# Patient Record
Sex: Female | Born: 1980
Health system: Southern US, Community
[De-identification: ages and names within clinical notes are randomized; demographics above are authoritative.]

## PROBLEM LIST (undated history)

## (undated) DIAGNOSIS — G47419 Narcolepsy without cataplexy: Secondary | ICD-10-CM

## (undated) DIAGNOSIS — G5601 Carpal tunnel syndrome, right upper limb: Secondary | ICD-10-CM

## (undated) DIAGNOSIS — F32A Depression, unspecified: Secondary | ICD-10-CM

## (undated) DIAGNOSIS — R51 Headache: Secondary | ICD-10-CM

## (undated) DIAGNOSIS — K743 Primary biliary cirrhosis: Secondary | ICD-10-CM

## (undated) HISTORY — DX: Primary biliary cirrhosis: K74.3

## (undated) HISTORY — PX: UPPER GASTROINTESTINAL ENDOSCOPY: SHX188

## (undated) HISTORY — PX: ANKLE FRACTURE SURGERY: SHX122

## (undated) HISTORY — DX: Depression, unspecified: F32.A

## (undated) HISTORY — DX: Narcolepsy without cataplexy: G47.419

---

## 1998-06-23 ENCOUNTER — Emergency Department (HOSPITAL_COMMUNITY): Admission: EM | Admit: 1998-06-23 | Discharge: 1998-06-23 | Payer: Self-pay | Admitting: Emergency Medicine

## 1999-03-01 ENCOUNTER — Emergency Department (HOSPITAL_COMMUNITY): Admission: EM | Admit: 1999-03-01 | Discharge: 1999-03-01 | Payer: Self-pay | Admitting: Emergency Medicine

## 2002-08-21 ENCOUNTER — Other Ambulatory Visit: Admission: RE | Admit: 2002-08-21 | Discharge: 2002-08-21 | Payer: Self-pay | Admitting: Obstetrics and Gynecology

## 2003-04-26 ENCOUNTER — Encounter: Payer: Self-pay | Admitting: Emergency Medicine

## 2003-04-26 ENCOUNTER — Emergency Department (HOSPITAL_COMMUNITY): Admission: EM | Admit: 2003-04-26 | Discharge: 2003-04-26 | Payer: Self-pay | Admitting: Emergency Medicine

## 2004-09-20 ENCOUNTER — Emergency Department (HOSPITAL_COMMUNITY): Admission: EM | Admit: 2004-09-20 | Discharge: 2004-09-20 | Payer: Self-pay | Admitting: Family Medicine

## 2007-02-23 ENCOUNTER — Emergency Department (HOSPITAL_COMMUNITY): Admission: EM | Admit: 2007-02-23 | Discharge: 2007-02-24 | Payer: Self-pay | Admitting: Emergency Medicine

## 2008-02-22 ENCOUNTER — Emergency Department (HOSPITAL_COMMUNITY): Admission: EM | Admit: 2008-02-22 | Discharge: 2008-02-22 | Payer: Self-pay | Admitting: Family Medicine

## 2008-02-26 ENCOUNTER — Encounter: Admission: RE | Admit: 2008-02-26 | Discharge: 2008-02-26 | Payer: Self-pay | Admitting: Family Medicine

## 2008-02-26 ENCOUNTER — Encounter (INDEPENDENT_AMBULATORY_CARE_PROVIDER_SITE_OTHER): Payer: Self-pay | Admitting: General Surgery

## 2008-02-26 ENCOUNTER — Observation Stay (HOSPITAL_COMMUNITY): Admission: EM | Admit: 2008-02-26 | Discharge: 2008-02-27 | Payer: Self-pay | Admitting: Emergency Medicine

## 2008-02-26 HISTORY — PX: APPENDECTOMY: SHX54

## 2009-12-09 ENCOUNTER — Other Ambulatory Visit: Admission: RE | Admit: 2009-12-09 | Discharge: 2009-12-09 | Payer: Self-pay | Admitting: Obstetrics and Gynecology

## 2010-03-30 ENCOUNTER — Inpatient Hospital Stay (HOSPITAL_COMMUNITY): Admission: AD | Admit: 2010-03-30 | Discharge: 2010-03-30 | Payer: Self-pay | Admitting: Obstetrics and Gynecology

## 2010-06-14 ENCOUNTER — Inpatient Hospital Stay (HOSPITAL_COMMUNITY): Admission: AD | Admit: 2010-06-14 | Discharge: 2010-06-17 | Payer: Self-pay | Admitting: Obstetrics and Gynecology

## 2010-12-08 ENCOUNTER — Other Ambulatory Visit (HOSPITAL_COMMUNITY)
Admission: RE | Admit: 2010-12-08 | Discharge: 2010-12-08 | Disposition: A | Discharge status: Home / Self Care | Payer: Medicaid Other | Source: Ambulatory Visit | Attending: Obstetrics and Gynecology | Admitting: Obstetrics and Gynecology

## 2010-12-08 ENCOUNTER — Other Ambulatory Visit: Payer: Self-pay | Admitting: Obstetrics and Gynecology

## 2010-12-08 DIAGNOSIS — Z1159 Encounter for screening for other viral diseases: Secondary | ICD-10-CM | POA: Insufficient documentation

## 2010-12-08 DIAGNOSIS — Z01419 Encounter for gynecological examination (general) (routine) without abnormal findings: Secondary | ICD-10-CM | POA: Insufficient documentation

## 2011-01-10 ENCOUNTER — Other Ambulatory Visit: Payer: Self-pay | Admitting: Obstetrics and Gynecology

## 2011-01-21 ENCOUNTER — Other Ambulatory Visit (HOSPITAL_COMMUNITY): Payer: Medicaid Other

## 2011-01-21 LAB — CBC
HCT: 35.8 % — ABNORMAL LOW (ref 36.0–46.0)
HCT: 40.2 % (ref 36.0–46.0)
Hemoglobin: 12.3 g/dL (ref 12.0–15.0)
Hemoglobin: 13.3 g/dL (ref 12.0–15.0)
MCH: 29.1 pg (ref 26.0–34.0)
MCH: 30 pg (ref 26.0–34.0)
MCHC: 33 g/dL (ref 30.0–36.0)
MCHC: 34.4 g/dL (ref 30.0–36.0)
MCV: 87.3 fL (ref 78.0–100.0)
MCV: 88 fL (ref 78.0–100.0)
Platelets: 223 10*3/uL (ref 150–400)
Platelets: 249 10*3/uL (ref 150–400)
RBC: 4.1 MIL/uL (ref 3.87–5.11)
RBC: 4.57 MIL/uL (ref 3.87–5.11)
RDW: 14 % (ref 11.5–15.5)
RDW: 14.4 % (ref 11.5–15.5)
WBC: 10.6 10*3/uL — ABNORMAL HIGH (ref 4.0–10.5)
WBC: 10.9 10*3/uL — ABNORMAL HIGH (ref 4.0–10.5)

## 2011-01-21 LAB — RPR: RPR Ser Ql: NONREACTIVE

## 2011-01-24 LAB — URINALYSIS, ROUTINE W REFLEX MICROSCOPIC
Bilirubin Urine: NEGATIVE
Glucose, UA: NEGATIVE mg/dL
Ketones, ur: NEGATIVE mg/dL
Nitrite: NEGATIVE
Protein, ur: NEGATIVE mg/dL
Specific Gravity, Urine: <1.005 — ABNORMAL LOW (ref 1.005–1.030)
Urobilinogen, UA: 0.2 mg/dL (ref 0.0–1.0)
pH: 7 (ref 5.0–8.0)

## 2011-01-24 LAB — URINE MICROSCOPIC-ADD ON

## 2011-01-25 ENCOUNTER — Encounter (HOSPITAL_COMMUNITY)
Admission: RE | Admit: 2011-01-25 | Discharge: 2011-01-25 | Disposition: A | Discharge status: Home / Self Care | Payer: Medicaid Other | Source: Ambulatory Visit | Attending: Obstetrics and Gynecology | Admitting: Obstetrics and Gynecology

## 2011-01-25 LAB — CBC
HCT: 41.5 % (ref 36.0–46.0)
Hemoglobin: 14 g/dL (ref 12.0–15.0)
MCH: 28.3 pg (ref 26.0–34.0)
MCHC: 33.7 g/dL (ref 30.0–36.0)
MCV: 84 fL (ref 78.0–100.0)
Platelets: 308 10*3/uL (ref 150–400)
RBC: 4.94 MIL/uL (ref 3.87–5.11)
RDW: 12.8 % (ref 11.5–15.5)
WBC: 8.2 10*3/uL (ref 4.0–10.5)

## 2011-01-25 LAB — SURGICAL PCR SCREEN
MRSA, PCR: NEGATIVE
Staphylococcus aureus: NEGATIVE

## 2011-01-27 ENCOUNTER — Ambulatory Visit (HOSPITAL_COMMUNITY)
Admission: RE | Admit: 2011-01-27 | Discharge: 2011-01-27 | Disposition: A | Discharge status: Home / Self Care | Payer: Medicaid Other | Source: Ambulatory Visit | Attending: Obstetrics and Gynecology | Admitting: Obstetrics and Gynecology

## 2011-01-27 DIAGNOSIS — Z302 Encounter for sterilization: Secondary | ICD-10-CM | POA: Insufficient documentation

## 2011-01-27 HISTORY — PX: TUBAL LIGATION: SHX77

## 2011-01-27 LAB — PREGNANCY, URINE: Preg Test, Ur: NEGATIVE

## 2011-02-01 NOTE — Op Note (Signed)
  [name], Tara Vega                ACCOUNT NO.:  [id number]  MEDICAL RECORD NO.:  [id number]        PATIENT TYPE:  WAMB  LOCATION:                                FACILITY:  [facility]  PHYSICIAN:  Gerald Leitz, MD          DATE OF BIRTH:  1981-09-02  DATE OF PROCEDURE: DATE OF DISCHARGE:                              OPERATIVE REPORT   PREOPERATIVE DIAGNOSIS:  Desires permanent sterilization.  POSTOPERATIVE DIAGNOSIS:  Desires permanent sterilization.  PROCEDURE:  Laparoscopic bilateral tubal ligation with Filshie clips.  SURGEON:  Gerald Leitz, MD  ASSISTANT:  None.  ANESTHESIA:  General.  FINDINGS:  Normal uterus, fallopian tubes, and ovaries.  SPECIMEN:  None.  ESTIMATED BLOOD LOSS:  Minimal.  COMPLICATIONS:  None.  PROCEDURE IN DETAIL:  The patient was taken to the operating room where she was placed under general anesthesia and placed in the dorsal lithotomy position.  Her abdomen was prepped and draped in the usual sterile fashion.  The vagina was prepped and a uterine manipulator was placed.  The bladder wall was in-and-out catheterized.  Attention was turned to the umbilicus where a 10-mm incision was made with a scalpel. Prior to doing this, it was injected with 10 mL of 0.25% Marcaine.  The 10-mm trocar was placed under direct visualization.  Once peritoneal placement was visualized, pneumoperitoneum was achieved with CO2 gas. Pelvis was examined with the findings noted above.  The fimbriae were identified bilaterally and Filshie clips were placed across the right fallopian tube with an operative laparoscope.  This was repeated on the left fallopian tube.  Pneumoperitoneum was released and the umbilical trocar was removed under direct visualization.  The fascia was identified at the umbilical incision and reapproximated with 0-Vicryl. Skin was closed with 4-0 Vicryl.  Attention was turned to the vagina where the uterine manipulator was removed.  The patient was noted  to have some bleeding from the tenaculum sites.  Silver nitrate was applied and excellent hemostasis was noted.  The patient was awakened from anesthesia and taken to recovery room awake and in stable condition. Sponge, lap, and needle counts were correct x2.     Gerald Leitz, MD     TC/MEDQ  D:  01/27/2011  T:  01/28/2011  Job:  [id number]  Electronically Signed by Gerald Leitz MD on 02/01/2011 06:34:23 PM

## 2011-03-22 NOTE — H&P (Signed)
NAMEAMENA, DOCKHAM                ACCOUNT NO.:  [id number]   MEDICAL RECORD NO.:  [id number]          PATIENT TYPE:  INP   LOCATION:  5127                         FACILITY:  [facility]   PHYSICIAN:  Cherylynn Ridges, M.D.    DATE OF BIRTH:  04/05/1981   DATE OF ADMISSION:  02/26/2008  DATE OF DISCHARGE:                              HISTORY & PHYSICAL   IDENTIFICATION:   CHIEF COMPLAINT:  The patient is a 30 year old with abdominal pain and  the CT scan demonstrating acute appendicitis.   HISTORY OF PRESENT ILLNESS:  The patient started getting ill yesterday  evening with abdominal pain, sort of localized in the lower portion of  abdomen and then towards the right lower quadrant.  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  She went in to see her primary care physician about 1 o'clock  this afternoon, who thought the patient had acute appendicitis, sent her  to diagnostic imaging for CT scan.  This demonstrate acute appendicitis.   The patient had a similar episode about 1 year ago of abdominal pain,  which subsided in less than 24 hours, but was very similar in nature  with an ultrasound not demonstrating acute appendicitis.  With the CT  findings today, a surgical consultation was obtained.   PAST MEDICAL HISTORY:  Unremarkable.   PAST SURGICAL HISTORY:  None.  She has had no previous surgery.   MEDICATIONS:  Only Depo for birth control.   ALLERGIES:  She has allergies to ASPIRIN only.   SOCIAL HISTORY:  She is a nonsmoker, nondrinker, does not take any  drugs.  She is employed as a Armed forces training and education officer.   REVIEW OF SYSTEMS:  She has had a cough for the past week, was seen in  Urgent Care, diagnosed with bronchitis, not placed on any antibiotics.   FAMILY HISTORY:  Noncontributory.   PHYSICAL EXAMINATION:  VITAL SIGNS:  On examination, she is afebrile at  97.8, pulse 87, blood pressure 121/87, O2 sats are 100% on room air.  HEENT:  She is normocephalic and atraumatic and anicteric.  NECK:  Supple with no cervical adenopathy and no carotid bruits.  LUNGS:  Clear to auscultation.  CARDIAC:  Regular rhythm and rate with no murmurs.  ABDOMEN:  Soft in all four quadrants with hypoactive bowel sounds.  She  is tender in the right lower quadrant with voluntary guarding.  She has  tap and shake tenderness, rebound tenderness in the right lower quadrant  and a positive Rovsing sign.  RECTAL AND PELVIC:  Not performed.   LABORATORY DATA:  Laboratory studies are pending.  A CBC is pending.  UA  is pending.  I did review her CT scan, which demonstrated a cylindrical  thickened wall structure in the right lower quadrant adjacent to the  cecum.  Diagnostic of acute appendicitis with some periappendiceal  stranding.   IMPRESSION:  Acute appendicitis without rupture.  The plan is to perform  laparoscopic appendectomy with possibility of open based on anatomy and  inflammation.  I have  explained this to the patient and wishes to  proceed as soon as we are able to go to the operating room.  She  understands.      Cherylynn Ridges, M.D.  Electronically Signed     JOW/MEDQ  D:  02/26/2008  T:  02/27/2008  Job:  [id number]

## 2011-03-22 NOTE — Op Note (Signed)
NAMEMARYAN, Tara Vega                ACCOUNT NO.:  [id number]   MEDICAL RECORD NO.:  [id number]          PATIENT TYPE:  INP   LOCATION:  1833                         FACILITY:  [facility]   PHYSICIAN:  Cherylynn Ridges, M.D.    DATE OF BIRTH:  1981-01-29   DATE OF PROCEDURE:  02/26/2008  DATE OF DISCHARGE:                               OPERATIVE REPORT   PREOPERATIVE DIAGNOSIS:  Acute appendicitis.   POSTOPERATIVE DIAGNOSIS:  Acute appendicitis.   PROCEDURE:  Laparoscopic appendectomy.   SURGEON:  Dr. Lindie Spruce.   ANESTHESIA:  General endotracheal.   ESTIMATED BLOOD LOSS:  Less than 50 mL.   COMPLICATIONS:  None.   CONDITION:  Stable.   FINDINGS:  The patient had an acutely inflamed partially  retroperitonealized appendix without perforation.   INDICATIONS FOR OPERATION:  The patient is a 30 year old with abdominal  pain for 24 hours, localized to the right lower quadrant, a history of  pain like this about a year ago, likely chronic relapsing acute  appendicitis.   OPERATION:  The patient was taken to the operating room and placed on  the table in supine position.  After an adequate general endotracheal  anesthetic was administered, she was prepped and draped in the usual  sterile manner.   A supraumbilical curvilinear incision was made using an #11 blade taken  down to the midline fascia.  We grabbed the fascia with 2 Kocher clamps  and then incised the fascia longitudinally with a 15 blade into the  preperitoneal space.  While tenting up on the fascial edges with Kocher  clamps, we bluntly dissected down into the peritoneal cavity and  subsequently passed a pursestring suture of 0 Vicryl around the fascial  opening.  We passed a Hasson cannula into the peritoneal cavity and  subsequently a right upper quadrant 5-mm cannula and a suprapubic 12-mm  cannula under direct vision after insufflating the abdomen with carbon  dioxide gas.   The patient was placed in Trendelenburg.  The  left side was tilted down.  The acutely inflamed appendix was noted in the right lower quadrant and  was partially retroperitonealized by the inflammation.  We were able to  dissect this away from the posterolateral wall using electrocautery and  blunt dissection, freeing up the appendix at the base of the cecum.  We  made a window between the mesoappendix and the base of the appendix  using a dissector and then came across the base of the appendix at the  cecum with an Endo-GIA 3.5-mm closure stapler.  The mesoappendix was  taken with 2.5-mm closure staples.  There was minimal bleeding.  We used  irrigation and electrocautery to control bleeding.  There was minimal  bleeding.  We irrigated with 2 bags of saline solution.  Once this was  done, the patient was flattened out.  Irrigation and all gas were  removed from the pelvis and the right upper quadrant, then all  instruments were removed.   The supraumbilical fascia was closed using a pursestring suture which  was in place.  We injected 0.25% Marcaine with epi  at all sites.  We  closed the supraumbilical and suprapubic sites with a running  subcuticular stitch of 5-0 Vicryl and then Dermabond was applied to all  incisions, including closing the lateral 5-mm cannula sites.  Steri-  Strips, Dermabond and Tegaderm were applied.  All counts were correct,  including needles, sponges and instruments.      Cherylynn Ridges, M.D.  Electronically Signed     JOW/MEDQ  D:  02/26/2008  T:  02/26/2008  Job:  [id number]

## 2011-08-02 LAB — DIFFERENTIAL
Basophils Absolute: 0.1
Basophils Relative: 1
Eosinophils Absolute: 0.1
Eosinophils Relative: 1
Lymphocytes Relative: 36
Lymphs Abs: 3.7
Monocytes Absolute: 0.4
Monocytes Relative: 4
Neutro Abs: 5.9
Neutrophils Relative %: 58

## 2011-08-02 LAB — URINALYSIS, ROUTINE W REFLEX MICROSCOPIC
Bilirubin Urine: NEGATIVE
Glucose, UA: NEGATIVE
Hgb urine dipstick: NEGATIVE
Ketones, ur: NEGATIVE
Nitrite: NEGATIVE
Protein, ur: NEGATIVE
Specific Gravity, Urine: >1.046 — ABNORMAL HIGH
Urobilinogen, UA: 1
pH: 6

## 2011-08-02 LAB — CBC
HCT: 39.4
Hemoglobin: 13.7
MCHC: 34.7
MCV: 89.4
Platelets: 268
RBC: 4.4
RDW: 12.5
WBC: 10.1

## 2011-09-06 ENCOUNTER — Other Ambulatory Visit (HOSPITAL_COMMUNITY)
Admission: RE | Admit: 2011-09-06 | Discharge: 2011-09-06 | Disposition: A | Discharge status: Home / Self Care | Payer: Medicaid Other | Source: Ambulatory Visit | Attending: Obstetrics and Gynecology | Admitting: Obstetrics and Gynecology

## 2011-09-06 ENCOUNTER — Other Ambulatory Visit: Payer: Self-pay | Admitting: Obstetrics and Gynecology

## 2011-09-06 DIAGNOSIS — Z01419 Encounter for gynecological examination (general) (routine) without abnormal findings: Secondary | ICD-10-CM | POA: Insufficient documentation

## 2011-12-14 ENCOUNTER — Other Ambulatory Visit: Payer: Self-pay | Admitting: Obstetrics and Gynecology

## 2011-12-14 ENCOUNTER — Other Ambulatory Visit (HOSPITAL_COMMUNITY)
Admission: RE | Admit: 2011-12-14 | Discharge: 2011-12-14 | Disposition: A | Discharge status: Home / Self Care | Payer: Medicaid Other | Source: Ambulatory Visit | Attending: Obstetrics and Gynecology | Admitting: Obstetrics and Gynecology

## 2011-12-14 DIAGNOSIS — Z01419 Encounter for gynecological examination (general) (routine) without abnormal findings: Secondary | ICD-10-CM | POA: Insufficient documentation

## 2012-09-13 ENCOUNTER — Other Ambulatory Visit: Payer: Self-pay | Admitting: Orthopedic Surgery

## 2012-10-07 DIAGNOSIS — G5601 Carpal tunnel syndrome, right upper limb: Secondary | ICD-10-CM

## 2012-10-07 HISTORY — DX: Carpal tunnel syndrome, right upper limb: G56.01

## 2012-10-08 ENCOUNTER — Encounter (HOSPITAL_BASED_OUTPATIENT_CLINIC_OR_DEPARTMENT_OTHER): Payer: Self-pay | Admitting: *Deleted

## 2012-10-08 NOTE — Pre-Procedure Instructions (Signed)
Phentermine use discussed with Dr. Michelle Piper; pt. OK to come for surgery.

## 2012-10-11 ENCOUNTER — Ambulatory Visit (HOSPITAL_BASED_OUTPATIENT_CLINIC_OR_DEPARTMENT_OTHER)
Admission: RE | Admit: 2012-10-11 | Discharge: 2012-10-11 | Disposition: A | Discharge status: Home / Self Care | Payer: Medicaid Other | Source: Ambulatory Visit | Attending: Orthopedic Surgery | Admitting: Orthopedic Surgery

## 2012-10-11 ENCOUNTER — Encounter (HOSPITAL_BASED_OUTPATIENT_CLINIC_OR_DEPARTMENT_OTHER): Payer: Self-pay | Admitting: Anesthesiology

## 2012-10-11 ENCOUNTER — Ambulatory Visit (HOSPITAL_BASED_OUTPATIENT_CLINIC_OR_DEPARTMENT_OTHER): Payer: Medicaid Other | Admitting: Anesthesiology

## 2012-10-11 ENCOUNTER — Encounter (HOSPITAL_BASED_OUTPATIENT_CLINIC_OR_DEPARTMENT_OTHER)
Admission: RE | Disposition: A | Discharge status: Home / Self Care | Payer: Self-pay | Source: Ambulatory Visit | Attending: Orthopedic Surgery

## 2012-10-11 ENCOUNTER — Encounter (HOSPITAL_BASED_OUTPATIENT_CLINIC_OR_DEPARTMENT_OTHER): Payer: Self-pay | Admitting: *Deleted

## 2012-10-11 DIAGNOSIS — G5600 Carpal tunnel syndrome, unspecified upper limb: Secondary | ICD-10-CM | POA: Insufficient documentation

## 2012-10-11 HISTORY — DX: Headache: R51

## 2012-10-11 HISTORY — PX: CARPAL TUNNEL RELEASE: SHX101

## 2012-10-11 HISTORY — DX: Carpal tunnel syndrome, right upper limb: G56.01

## 2012-10-11 LAB — POCT HEMOGLOBIN-HEMACUE: Hemoglobin: 14.8 g/dL (ref 12.0–15.0)

## 2012-10-11 SURGERY — CARPAL TUNNEL RELEASE
Anesthesia: Monitor Anesthesia Care | Site: Hand | Laterality: Right | Wound class: Clean

## 2012-10-11 MED ORDER — OXYCODONE HCL 5 MG PO TABS: 5.0000 mg | ORAL_TABLET | Freq: Once | ORAL | Status: DC | PRN

## 2012-10-11 MED ORDER — CEFAZOLIN SODIUM-DEXTROSE 2-3 GM-% IV SOLR
2.0000 g | INTRAVENOUS | Status: AC
  Administered 2012-10-11: 2 g via INTRAVENOUS

## 2012-10-11 MED ORDER — BUPIVACAINE HCL (PF) 0.25 % IJ SOLN
INTRAMUSCULAR | Status: DC | PRN
  Administered 2012-10-11: 20 mL

## 2012-10-11 MED ORDER — LACTATED RINGERS IV SOLN
INTRAVENOUS | Status: DC
  Administered 2012-10-11: 12:00:00 via INTRAVENOUS

## 2012-10-11 MED ORDER — HYDROMORPHONE HCL PF 1 MG/ML IJ SOLN
0.2500 mg | INTRAMUSCULAR | Status: DC | PRN
  Administered 2012-10-11 (×2): 0.5 mg via INTRAVENOUS

## 2012-10-11 MED ORDER — HYDROCODONE-ACETAMINOPHEN 5-500 MG PO TABS: 2.0000 | ORAL_TABLET | ORAL | Status: DC | PRN

## 2012-10-11 MED ORDER — MIDAZOLAM HCL 5 MG/5ML IJ SOLN
INTRAMUSCULAR | Status: DC | PRN
  Administered 2012-10-11: 2 mg via INTRAVENOUS

## 2012-10-11 MED ORDER — CHLORHEXIDINE GLUCONATE 4 % EX LIQD: 60.0000 mL | Freq: Once | CUTANEOUS | Status: DC

## 2012-10-11 MED ORDER — OXYCODONE HCL 5 MG/5ML PO SOLN: 5.0000 mg | Freq: Once | ORAL | Status: DC | PRN

## 2012-10-11 MED ORDER — FENTANYL CITRATE 0.05 MG/ML IJ SOLN
INTRAMUSCULAR | Status: DC | PRN
  Administered 2012-10-11: 100 ug via INTRAVENOUS

## 2012-10-11 MED ORDER — SODIUM CHLORIDE 0.45 % IV SOLN: INTRAVENOUS | Status: DC

## 2012-10-11 SURGICAL SUPPLY — 46 items
BANDAGE CONFORM 3  STR LF (GAUZE/BANDAGES/DRESSINGS) ×2 IMPLANT
BANDAGE ELASTIC 3 VELCRO ST LF (GAUZE/BANDAGES/DRESSINGS) ×2 IMPLANT
BLADE CARPAL TUNNEL SNGL USE (BLADE) ×2 IMPLANT
BLADE SURG 15 STRL LF DISP TIS (BLADE) ×2 IMPLANT
BLADE SURG 15 STRL SS (BLADE) ×2
BNDG PLASTER X FAST 3X3 WHT LF (CAST SUPPLIES) ×2 IMPLANT
BRUSH SCRUB EZ PLAIN DRY (MISCELLANEOUS) ×2 IMPLANT
CLOTH BEACON ORANGE TIMEOUT ST (SAFETY) ×2 IMPLANT
CORDS BIPOLAR (ELECTRODE) ×2 IMPLANT
COVER MAYO STAND STRL (DRAPES) ×2 IMPLANT
COVER TABLE BACK 60X90 (DRAPES) ×2 IMPLANT
CUFF TOURNIQUET SINGLE 18IN (TOURNIQUET CUFF) ×2 IMPLANT
DRAIN PENROSE 1/4X12 LTX STRL (WOUND CARE) IMPLANT
DRAPE EXTREMITY T 121X128X90 (DRAPE) ×2 IMPLANT
DRAPE SURG 17X23 STRL (DRAPES) ×2 IMPLANT
DRSG EMULSION OIL 3X3 NADH (GAUZE/BANDAGES/DRESSINGS) ×2 IMPLANT
GAUZE SPONGE 4X4 16PLY XRAY LF (GAUZE/BANDAGES/DRESSINGS) ×2 IMPLANT
GLOVE BIOGEL M STRL SZ7.5 (GLOVE) ×4 IMPLANT
GLOVE BIOGEL PI IND STRL 8 (GLOVE) ×1 IMPLANT
GLOVE BIOGEL PI INDICATOR 8 (GLOVE) ×1
GLOVE SS BIOGEL STRL SZ 8 (GLOVE) ×1 IMPLANT
GLOVE SUPERSENSE BIOGEL SZ 8 (GLOVE) ×1
GOWN PREVENTION PLUS XLARGE (GOWN DISPOSABLE) ×2 IMPLANT
GOWN PREVENTION PLUS XXLARGE (GOWN DISPOSABLE) ×4 IMPLANT
LOOP VESSEL MAXI BLUE (MISCELLANEOUS) IMPLANT
NDL SAFETY ECLIPSE 18X1.5 (NEEDLE) ×2 IMPLANT
NEEDLE HYPO 18GX1.5 SHARP (NEEDLE) ×2
NEEDLE HYPO 22GX1.5 SAFETY (NEEDLE) IMPLANT
NEEDLE HYPO 25X1 1.5 SAFETY (NEEDLE) ×2 IMPLANT
NS IRRIG 1000ML POUR BTL (IV SOLUTION) ×2 IMPLANT
PACK BASIN DAY SURGERY FS (CUSTOM PROCEDURE TRAY) ×2 IMPLANT
PAD ALCOHOL SWAB (MISCELLANEOUS) ×16 IMPLANT
PAD CAST 3X4 CTTN HI CHSV (CAST SUPPLIES) ×2 IMPLANT
PADDING CAST ABS 4INX4YD NS (CAST SUPPLIES) ×1
PADDING CAST ABS COTTON 4X4 ST (CAST SUPPLIES) ×1 IMPLANT
PADDING CAST COTTON 3X4 STRL (CAST SUPPLIES) ×2
SPONGE GAUZE 4X4 12PLY (GAUZE/BANDAGES/DRESSINGS) IMPLANT
STOCKINETTE 4X48 STRL (DRAPES) ×2 IMPLANT
SUT PROLENE 4 0 PS 2 18 (SUTURE) ×2 IMPLANT
SYR BULB 3OZ (MISCELLANEOUS) ×2 IMPLANT
SYR CONTROL 10ML LL (SYRINGE) ×4 IMPLANT
TOWEL OR 17X24 6PK STRL BLUE (TOWEL DISPOSABLE) ×2 IMPLANT
TOWEL OR NON WOVEN STRL DISP B (DISPOSABLE) ×2 IMPLANT
TRAY DSU PREP LF (CUSTOM PROCEDURE TRAY) ×2 IMPLANT
UNDERPAD 30X30 INCONTINENT (UNDERPADS AND DIAPERS) ×2 IMPLANT
WATER STERILE IRR 1000ML POUR (IV SOLUTION) ×2 IMPLANT

## 2012-10-11 NOTE — Op Note (Signed)
See [id number] Dominica Severin MD

## 2012-10-11 NOTE — Anesthesia Preprocedure Evaluation (Signed)
Anesthesia Evaluation  Patient identified by MRN, date of birth, ID band Patient awake    Reviewed: Allergy & Precautions, H&P , NPO status , Patient's Chart, lab work & pertinent test results  Airway Mallampati: II TM Distance: >3 FB Neck ROM: Full    Dental No notable dental hx. (+) Teeth Intact and Dental Advisory Given   Pulmonary neg pulmonary ROS,  breath sounds clear to auscultation  Pulmonary exam normal       Cardiovascular negative cardio ROS  Rhythm:Regular Rate:Normal     Neuro/Psych  Headaches,  Neuromuscular disease negative psych ROS   GI/Hepatic negative GI ROS, Neg liver ROS,   Endo/Other  Morbid obesity  Renal/GU negative Renal ROS  negative genitourinary   Musculoskeletal   Abdominal   Peds  Hematology negative hematology ROS (+)   Anesthesia Other Findings   Reproductive/Obstetrics negative OB ROS                           Anesthesia Physical Anesthesia Plan  ASA: III  Anesthesia Plan: MAC   Post-op Pain Management:    Induction: Intravenous  Airway Management Planned: Simple Face Mask  Additional Equipment:   Intra-op Plan:   Post-operative Plan:   Informed Consent: I have reviewed the patients History and Physical, chart, labs and discussed the procedure including the risks, benefits and alternatives for the proposed anesthesia with the patient or authorized representative who has indicated his/her understanding and acceptance.   Dental advisory given  Plan Discussed with: CRNA  Anesthesia Plan Comments:         Anesthesia Quick Evaluation

## 2012-10-11 NOTE — Transfer of Care (Signed)
Immediate Anesthesia Transfer of Care Note  Patient: Tara Vega  Procedure(s) Performed: Procedure(s) (LRB) with comments: CARPAL TUNNEL RELEASE (Right)  Patient Location: PACU  Anesthesia Type:MAC  Level of Consciousness: awake, alert  and oriented  Airway & Oxygen Therapy: Patient Spontanous Breathing  Post-op Assessment: Report given to PACU RN and Post -op Vital signs reviewed and stable  Post vital signs: Reviewed and stable  Complications: No apparent anesthesia complications

## 2012-10-11 NOTE — H&P (Signed)
Tara Vega is an 31 y.o. female.   Chief Complaint: Presents for R CTR HPI: Tara KitchenMarland KitchenPatient presents for evaluation and treatment of the of their upper extremity predicament. The patient denies neck back chest or of abdominal pain. The patient notes that they have no lower extremity problems. The patient from primarily complains of the upper extremity pain noted.  Past Medical History  Diagnosis Date  . Headache     tension; migraines 1-2 x/month  . Carpal tunnel syndrome, right 10/2012    Past Surgical History  Procedure Date  . Tubal ligation 01/27/2011    laparoscopic  . Appendectomy 02/26/2008    laparoscopic  . Ankle fracture surgery     History reviewed. No pertinent family history. Social History:  reports that she has never smoked. She has never used smokeless tobacco. She reports that she does not drink alcohol or use illicit drugs.  Allergies:  Allergies  Allergen Reactions  . Aspirin Other (See Comments)    NOSEBLEEDS    Medications Prior to Admission  Medication Sig Dispense Refill  . Multiple Vitamin (MULTIVITAMIN) tablet Take 1 tablet by mouth daily.      . phentermine 37.5 MG capsule Take 18.75 mg by mouth every morning.       . sertraline (ZOLOFT) 100 MG tablet Take 100 mg by mouth daily.        No results found for this or any previous visit (from the past 48 hour(s)). No results found.  Review of Systems  HENT: Negative.   Respiratory: Negative.   Cardiovascular: Negative.   Gastrointestinal: Negative.   Genitourinary: Negative.   Neurological: Negative.   Endo/Heme/Allergies: Negative.     Blood pressure 131/85, pulse 68, temperature 98 F (36.7 C), temperature source Oral, resp. rate 20, height 5\' 1"  (1.549 m), weight 97.07 kg (214 lb), last menstrual period 10/11/2012, SpO2 96.00%. Physical Exam ..The patient is alert and oriented in no acute distress the patient complains of pain in the affected upper extremity. The patient is noted to have a  normal HEENT exam. Lung fields show equal chest expansion and no shortness of breath abdomen exam is nontender without distention. Lower extremity examination does not show any fracture dislocation or blood clot symptoms. Pelvis is stable neck and back are stable and nontender  Presents with R CTS  Assessment/Plan .Tara KitchenWe are planning surgery for your upper extremity. The risk and benefits of surgery include risk of bleeding infection anesthesia damage to normal structures and failure of the surgery to accomplish its intended goals of relieving symptoms and restoring function with this in mind we'll going to proceed. I have specifically discussed with the patient the pre-and postoperative regime and the does and don'ts and risk and benefits in great detail. Risk and benefits of surgery also include risk of dystrophy chronic nerve pain failure of the healing process to go onto completion and other inherent risks of surgery The relavent the pathophysiology of the disease/injury process, as well as the alternatives for treatment and postoperative course of action has been discussed in great detail with the patient who desires to proceed.  We will do everything in our power to help you (the patient) restore function to the upper extremity. Is a pleasure to see this patient today.   Karen Chafe 10/11/2012, 12:31 PM

## 2012-10-11 NOTE — Anesthesia Postprocedure Evaluation (Signed)
  Anesthesia Post-op Note  Patient: Tara Vega  Procedure(s) Performed: Procedure(s) (LRB) with comments: CARPAL TUNNEL RELEASE (Right)  Patient Location: PACU  Anesthesia Type:MAC  Level of Consciousness: awake, alert  and oriented  Airway and Oxygen Therapy: Patient Spontanous Breathing  Post-op Pain: none  Post-op Assessment: Post-op Vital signs reviewed, Patient's Cardiovascular Status Stable, Respiratory Function Stable, Patent Airway and No signs of Nausea or vomiting  Post-op Vital Signs: Reviewed and stable  Complications: No apparent anesthesia complications

## 2012-10-12 ENCOUNTER — Encounter (HOSPITAL_BASED_OUTPATIENT_CLINIC_OR_DEPARTMENT_OTHER): Payer: Self-pay | Admitting: Orthopedic Surgery

## 2012-10-12 NOTE — Op Note (Signed)
NAMEHELLON, VACCARELLA                ACCOUNT NO.:  [id number]  MEDICAL RECORD NO.:  [id number]  LOCATION:                                 FACILITY:  PHYSICIAN:  Dionne Ano. Amanda Pea, M.D.     DATE OF BIRTH:  DATE OF PROCEDURE: DATE OF DISCHARGE:                              OPERATIVE REPORT   PREOPERATIVE DIAGNOSIS:  Right carpal tunnel syndrome.  POSTOPERATIVE DIAGNOSIS:  Right carpal tunnel syndrome.  PROCEDURE: 1. Right median nerve/peripheral nerve block at the wrist forearm     level for anesthetic purposes, for carpal tunnel release. 2. Right limited open carpal tunnel release.  SURGEON:  Dionne Ano. Amanda Pea, M.D.  ASSISTANT:  None.  COMPLICATIONS:  None.  ANESTHESIA:  Peripheral nerve block with IV sedation.  Keeping the patient awake, alert, and oriented in entire case.  OPERATION IN DETAIL:  The patient was seen by myself and anesthesia.  He was taken to the operative suite, and underwent smooth induction of peripheral nerve __________ block at the wrist forearm level.  Following this, she prepped and draped in usual sterile fashion.  Following this, arm was elevated, tourniquet insufflated to 250 mmHg.  An incision was made distally transcarpal ligament under 250 mmHg tourniquet control. The patient tolerated this well.  Time-out had been called.  Pre and postop check list complete.  Once incision was made 1 cm in nature, I released the palmar fascia, placed retractor in the wound and then released the distal edge of transcarpal ligament.  Fat pad was grasped nicely.  Distal to proximal dissection was carried out __________ 1, 2, and 3 which were passed under the proximal leading leaflet of the transcarpal ligament.  The patient tolerated this well.  There were no complicating features.  Following this, I then placed a security clip, obturator disengaged indicating correct placement and secured.  The knife was then placed and secured the clip effectively releasing  the transverse carpal ligament.  At this time, I inspected the canal.  All looked excellent.  She had complete release proximally and distally and there were no complicating features.  I irrigated copiously, deflated the tourniquet, and secured hemostasis, bipolar electrocautery. Following this, wound was closed.  She had excellent refill and excellent motion.  This was an uncomplicated carpal tunnel release.  The median nerve was hyperemic, intact, and had significant flattening and impressive wall thickness.  She had all the chronic signs and symptoms of very significant chronic compression.  Hopefully, the nerve root responded and healed itself after it is now released in a proper environment for suitable healing.  It was a pleasure to see her in a week and sutures out at 12 days.  She will keep the area clean and dry.  Be out of work for 1-2 weeks as we discussed.     Dionne Ano. Amanda Pea, M.D.     [facility]  D:  10/11/2012  T:  10/12/2012  Job:  [id number]

## 2013-03-11 ENCOUNTER — Other Ambulatory Visit (HOSPITAL_COMMUNITY)
Admission: RE | Admit: 2013-03-11 | Discharge: 2013-03-11 | Disposition: A | Discharge status: Home / Self Care | Payer: BC Managed Care – PPO | Source: Ambulatory Visit | Attending: Obstetrics and Gynecology | Admitting: Obstetrics and Gynecology

## 2013-03-11 ENCOUNTER — Other Ambulatory Visit: Payer: Self-pay | Admitting: Obstetrics and Gynecology

## 2013-03-11 DIAGNOSIS — Z01419 Encounter for gynecological examination (general) (routine) without abnormal findings: Secondary | ICD-10-CM | POA: Insufficient documentation

## 2014-03-12 ENCOUNTER — Other Ambulatory Visit (HOSPITAL_COMMUNITY)
Admission: RE | Admit: 2014-03-12 | Discharge: 2014-03-12 | Disposition: A | Discharge status: Home / Self Care | Payer: BC Managed Care – PPO | Source: Ambulatory Visit | Attending: Obstetrics and Gynecology | Admitting: Obstetrics and Gynecology

## 2014-03-12 ENCOUNTER — Other Ambulatory Visit: Payer: Self-pay | Admitting: Obstetrics and Gynecology

## 2014-03-12 DIAGNOSIS — Z1151 Encounter for screening for human papillomavirus (HPV): Secondary | ICD-10-CM | POA: Insufficient documentation

## 2014-03-12 DIAGNOSIS — Z01419 Encounter for gynecological examination (general) (routine) without abnormal findings: Secondary | ICD-10-CM | POA: Insufficient documentation

## 2014-06-30 ENCOUNTER — Emergency Department (HOSPITAL_COMMUNITY)
Admission: EM | Admit: 2014-06-30 | Discharge: 2014-06-30 | Disposition: A | Discharge status: Home / Self Care | Payer: BC Managed Care – PPO

## 2015-03-11 ENCOUNTER — Other Ambulatory Visit (HOSPITAL_COMMUNITY): Payer: Self-pay | Admitting: Obstetrics and Gynecology

## 2015-03-11 ENCOUNTER — Other Ambulatory Visit (HOSPITAL_COMMUNITY)
Admission: RE | Admit: 2015-03-11 | Discharge: 2015-03-11 | Disposition: A | Discharge status: Home / Self Care | Payer: BLUE CROSS/BLUE SHIELD | Source: Ambulatory Visit | Attending: Obstetrics and Gynecology | Admitting: Obstetrics and Gynecology

## 2015-03-11 DIAGNOSIS — Z1151 Encounter for screening for human papillomavirus (HPV): Secondary | ICD-10-CM | POA: Diagnosis present

## 2015-03-11 DIAGNOSIS — Z01411 Encounter for gynecological examination (general) (routine) with abnormal findings: Secondary | ICD-10-CM | POA: Insufficient documentation

## 2015-03-13 LAB — CYTOLOGY - PAP

## 2016-03-30 DIAGNOSIS — G47419 Narcolepsy without cataplexy: Secondary | ICD-10-CM | POA: Diagnosis not present

## 2016-05-26 DIAGNOSIS — G47419 Narcolepsy without cataplexy: Secondary | ICD-10-CM | POA: Diagnosis not present

## 2016-09-16 DIAGNOSIS — G47419 Narcolepsy without cataplexy: Secondary | ICD-10-CM | POA: Diagnosis not present

## 2016-09-16 DIAGNOSIS — Z6838 Body mass index (BMI) 38.0-38.9, adult: Secondary | ICD-10-CM | POA: Diagnosis not present

## 2016-09-16 DIAGNOSIS — Z23 Encounter for immunization: Secondary | ICD-10-CM | POA: Diagnosis not present

## 2016-09-16 DIAGNOSIS — G43909 Migraine, unspecified, not intractable, without status migrainosus: Secondary | ICD-10-CM | POA: Diagnosis not present

## 2016-09-16 DIAGNOSIS — F329 Major depressive disorder, single episode, unspecified: Secondary | ICD-10-CM | POA: Diagnosis not present

## 2016-11-04 DIAGNOSIS — Z0000 Encounter for general adult medical examination without abnormal findings: Secondary | ICD-10-CM | POA: Diagnosis not present

## 2016-11-04 DIAGNOSIS — Z136 Encounter for screening for cardiovascular disorders: Secondary | ICD-10-CM | POA: Diagnosis not present

## 2016-11-09 ENCOUNTER — Other Ambulatory Visit: Payer: Self-pay | Admitting: Family Medicine

## 2016-11-09 DIAGNOSIS — Z6839 Body mass index (BMI) 39.0-39.9, adult: Secondary | ICD-10-CM | POA: Diagnosis not present

## 2016-11-09 DIAGNOSIS — R109 Unspecified abdominal pain: Secondary | ICD-10-CM

## 2016-11-09 DIAGNOSIS — Z0000 Encounter for general adult medical examination without abnormal findings: Secondary | ICD-10-CM | POA: Diagnosis not present

## 2016-11-09 DIAGNOSIS — N63 Unspecified lump in unspecified breast: Secondary | ICD-10-CM

## 2016-11-09 DIAGNOSIS — Z23 Encounter for immunization: Secondary | ICD-10-CM | POA: Diagnosis not present

## 2016-11-15 ENCOUNTER — Ambulatory Visit
Admission: RE | Admit: 2016-11-15 | Discharge: 2016-11-15 | Disposition: A | Discharge status: Home / Self Care | Payer: BLUE CROSS/BLUE SHIELD | Source: Ambulatory Visit | Attending: Family Medicine | Admitting: Family Medicine

## 2016-11-15 DIAGNOSIS — N6002 Solitary cyst of left breast: Secondary | ICD-10-CM | POA: Diagnosis not present

## 2016-11-15 DIAGNOSIS — N63 Unspecified lump in unspecified breast: Secondary | ICD-10-CM

## 2016-11-15 DIAGNOSIS — R109 Unspecified abdominal pain: Secondary | ICD-10-CM

## 2016-11-15 DIAGNOSIS — R928 Other abnormal and inconclusive findings on diagnostic imaging of breast: Secondary | ICD-10-CM | POA: Diagnosis not present

## 2016-11-15 DIAGNOSIS — R935 Abnormal findings on diagnostic imaging of other abdominal regions, including retroperitoneum: Secondary | ICD-10-CM | POA: Diagnosis not present

## 2016-11-17 DIAGNOSIS — R1084 Generalized abdominal pain: Secondary | ICD-10-CM | POA: Diagnosis not present

## 2016-11-17 DIAGNOSIS — K760 Fatty (change of) liver, not elsewhere classified: Secondary | ICD-10-CM | POA: Diagnosis not present

## 2016-11-17 DIAGNOSIS — Z6838 Body mass index (BMI) 38.0-38.9, adult: Secondary | ICD-10-CM | POA: Diagnosis not present

## 2016-11-17 DIAGNOSIS — K219 Gastro-esophageal reflux disease without esophagitis: Secondary | ICD-10-CM | POA: Diagnosis not present

## 2017-01-31 DIAGNOSIS — K219 Gastro-esophageal reflux disease without esophagitis: Secondary | ICD-10-CM | POA: Diagnosis not present

## 2017-01-31 DIAGNOSIS — K2970 Gastritis, unspecified, without bleeding: Secondary | ICD-10-CM | POA: Diagnosis not present

## 2017-01-31 DIAGNOSIS — B9681 Helicobacter pylori [H. pylori] as the cause of diseases classified elsewhere: Secondary | ICD-10-CM | POA: Diagnosis not present

## 2017-01-31 DIAGNOSIS — Z01419 Encounter for gynecological examination (general) (routine) without abnormal findings: Secondary | ICD-10-CM | POA: Diagnosis not present

## 2017-01-31 DIAGNOSIS — Z23 Encounter for immunization: Secondary | ICD-10-CM | POA: Diagnosis not present

## 2017-01-31 DIAGNOSIS — Z124 Encounter for screening for malignant neoplasm of cervix: Secondary | ICD-10-CM | POA: Diagnosis not present

## 2017-01-31 DIAGNOSIS — N926 Irregular menstruation, unspecified: Secondary | ICD-10-CM | POA: Diagnosis not present

## 2017-05-15 DIAGNOSIS — G47419 Narcolepsy without cataplexy: Secondary | ICD-10-CM | POA: Diagnosis not present

## 2017-05-15 DIAGNOSIS — Z23 Encounter for immunization: Secondary | ICD-10-CM | POA: Diagnosis not present

## 2017-05-15 DIAGNOSIS — F3281 Premenstrual dysphoric disorder: Secondary | ICD-10-CM | POA: Diagnosis not present

## 2017-05-15 DIAGNOSIS — N926 Irregular menstruation, unspecified: Secondary | ICD-10-CM | POA: Diagnosis not present

## 2017-05-15 DIAGNOSIS — Z6839 Body mass index (BMI) 39.0-39.9, adult: Secondary | ICD-10-CM | POA: Diagnosis not present

## 2017-06-12 DIAGNOSIS — G47419 Narcolepsy without cataplexy: Secondary | ICD-10-CM | POA: Diagnosis not present

## 2017-07-17 DIAGNOSIS — G47419 Narcolepsy without cataplexy: Secondary | ICD-10-CM | POA: Diagnosis not present

## 2017-07-17 DIAGNOSIS — Z6838 Body mass index (BMI) 38.0-38.9, adult: Secondary | ICD-10-CM | POA: Diagnosis not present

## 2017-07-17 DIAGNOSIS — Z23 Encounter for immunization: Secondary | ICD-10-CM | POA: Diagnosis not present

## 2017-07-17 DIAGNOSIS — F3281 Premenstrual dysphoric disorder: Secondary | ICD-10-CM | POA: Diagnosis not present

## 2017-07-17 DIAGNOSIS — N926 Irregular menstruation, unspecified: Secondary | ICD-10-CM | POA: Diagnosis not present

## 2017-09-26 DIAGNOSIS — G47419 Narcolepsy without cataplexy: Secondary | ICD-10-CM | POA: Diagnosis not present

## 2017-10-20 ENCOUNTER — Ambulatory Visit (INDEPENDENT_AMBULATORY_CARE_PROVIDER_SITE_OTHER): Payer: BLUE CROSS/BLUE SHIELD | Admitting: Licensed Clinical Social Worker

## 2017-10-20 DIAGNOSIS — F331 Major depressive disorder, recurrent, moderate: Secondary | ICD-10-CM | POA: Diagnosis not present

## 2017-11-16 ENCOUNTER — Ambulatory Visit: Payer: BLUE CROSS/BLUE SHIELD | Admitting: Licensed Clinical Social Worker

## 2017-11-20 ENCOUNTER — Ambulatory Visit (INDEPENDENT_AMBULATORY_CARE_PROVIDER_SITE_OTHER): Payer: BLUE CROSS/BLUE SHIELD | Admitting: Licensed Clinical Social Worker

## 2017-11-20 DIAGNOSIS — F3341 Major depressive disorder, recurrent, in partial remission: Secondary | ICD-10-CM | POA: Diagnosis not present

## 2017-11-29 DIAGNOSIS — Z114 Encounter for screening for human immunodeficiency virus [HIV]: Secondary | ICD-10-CM | POA: Diagnosis not present

## 2017-11-29 DIAGNOSIS — Z0000 Encounter for general adult medical examination without abnormal findings: Secondary | ICD-10-CM | POA: Diagnosis not present

## 2017-11-29 DIAGNOSIS — Z1329 Encounter for screening for other suspected endocrine disorder: Secondary | ICD-10-CM | POA: Diagnosis not present

## 2017-11-29 DIAGNOSIS — Z13220 Encounter for screening for lipoid disorders: Secondary | ICD-10-CM | POA: Diagnosis not present

## 2017-11-30 DIAGNOSIS — Z0000 Encounter for general adult medical examination without abnormal findings: Secondary | ICD-10-CM | POA: Diagnosis not present

## 2017-11-30 DIAGNOSIS — Z6839 Body mass index (BMI) 39.0-39.9, adult: Secondary | ICD-10-CM | POA: Diagnosis not present

## 2017-11-30 DIAGNOSIS — G4700 Insomnia, unspecified: Secondary | ICD-10-CM | POA: Diagnosis not present

## 2017-11-30 DIAGNOSIS — F331 Major depressive disorder, recurrent, moderate: Secondary | ICD-10-CM | POA: Diagnosis not present

## 2017-12-06 ENCOUNTER — Ambulatory Visit: Payer: Self-pay | Admitting: Licensed Clinical Social Worker

## 2017-12-15 DIAGNOSIS — J069 Acute upper respiratory infection, unspecified: Secondary | ICD-10-CM | POA: Diagnosis not present

## 2017-12-15 DIAGNOSIS — Z6839 Body mass index (BMI) 39.0-39.9, adult: Secondary | ICD-10-CM | POA: Diagnosis not present

## 2018-01-11 ENCOUNTER — Ambulatory Visit (INDEPENDENT_AMBULATORY_CARE_PROVIDER_SITE_OTHER): Payer: BLUE CROSS/BLUE SHIELD | Admitting: Licensed Clinical Social Worker

## 2018-01-11 DIAGNOSIS — F3341 Major depressive disorder, recurrent, in partial remission: Secondary | ICD-10-CM | POA: Diagnosis not present

## 2018-01-18 DIAGNOSIS — F411 Generalized anxiety disorder: Secondary | ICD-10-CM | POA: Diagnosis not present

## 2018-01-18 DIAGNOSIS — Z6839 Body mass index (BMI) 39.0-39.9, adult: Secondary | ICD-10-CM | POA: Diagnosis not present

## 2018-01-18 DIAGNOSIS — F900 Attention-deficit hyperactivity disorder, predominantly inattentive type: Secondary | ICD-10-CM | POA: Diagnosis not present

## 2018-01-18 DIAGNOSIS — F331 Major depressive disorder, recurrent, moderate: Secondary | ICD-10-CM | POA: Diagnosis not present

## 2018-04-16 DIAGNOSIS — F411 Generalized anxiety disorder: Secondary | ICD-10-CM | POA: Diagnosis not present

## 2018-04-16 DIAGNOSIS — F331 Major depressive disorder, recurrent, moderate: Secondary | ICD-10-CM | POA: Diagnosis not present

## 2018-04-16 DIAGNOSIS — G4700 Insomnia, unspecified: Secondary | ICD-10-CM | POA: Diagnosis not present

## 2018-05-07 DIAGNOSIS — F4323 Adjustment disorder with mixed anxiety and depressed mood: Secondary | ICD-10-CM | POA: Diagnosis not present

## 2018-05-17 DIAGNOSIS — F4323 Adjustment disorder with mixed anxiety and depressed mood: Secondary | ICD-10-CM | POA: Diagnosis not present

## 2018-05-24 DIAGNOSIS — F4323 Adjustment disorder with mixed anxiety and depressed mood: Secondary | ICD-10-CM | POA: Diagnosis not present

## 2018-05-31 DIAGNOSIS — N921 Excessive and frequent menstruation with irregular cycle: Secondary | ICD-10-CM | POA: Diagnosis not present

## 2018-05-31 DIAGNOSIS — F4323 Adjustment disorder with mixed anxiety and depressed mood: Secondary | ICD-10-CM | POA: Diagnosis not present

## 2018-06-05 DIAGNOSIS — F4323 Adjustment disorder with mixed anxiety and depressed mood: Secondary | ICD-10-CM | POA: Diagnosis not present

## 2018-06-14 DIAGNOSIS — F4323 Adjustment disorder with mixed anxiety and depressed mood: Secondary | ICD-10-CM | POA: Diagnosis not present

## 2018-06-20 DIAGNOSIS — F4323 Adjustment disorder with mixed anxiety and depressed mood: Secondary | ICD-10-CM | POA: Diagnosis not present

## 2018-06-25 DIAGNOSIS — N924 Excessive bleeding in the premenopausal period: Secondary | ICD-10-CM | POA: Diagnosis not present

## 2018-06-25 DIAGNOSIS — F4323 Adjustment disorder with mixed anxiety and depressed mood: Secondary | ICD-10-CM | POA: Diagnosis not present

## 2018-06-25 DIAGNOSIS — N946 Dysmenorrhea, unspecified: Secondary | ICD-10-CM | POA: Diagnosis not present

## 2018-07-02 DIAGNOSIS — F4323 Adjustment disorder with mixed anxiety and depressed mood: Secondary | ICD-10-CM | POA: Diagnosis not present

## 2018-07-04 DIAGNOSIS — Z30430 Encounter for insertion of intrauterine contraceptive device: Secondary | ICD-10-CM | POA: Diagnosis not present

## 2018-07-16 DIAGNOSIS — G47419 Narcolepsy without cataplexy: Secondary | ICD-10-CM | POA: Diagnosis not present

## 2018-07-16 DIAGNOSIS — F411 Generalized anxiety disorder: Secondary | ICD-10-CM | POA: Diagnosis not present

## 2018-07-16 DIAGNOSIS — Z23 Encounter for immunization: Secondary | ICD-10-CM | POA: Diagnosis not present

## 2018-07-16 DIAGNOSIS — Z6841 Body Mass Index (BMI) 40.0 and over, adult: Secondary | ICD-10-CM | POA: Diagnosis not present

## 2018-07-16 DIAGNOSIS — G4700 Insomnia, unspecified: Secondary | ICD-10-CM | POA: Diagnosis not present

## 2018-07-19 DIAGNOSIS — F4323 Adjustment disorder with mixed anxiety and depressed mood: Secondary | ICD-10-CM | POA: Diagnosis not present

## 2018-08-08 DIAGNOSIS — F4323 Adjustment disorder with mixed anxiety and depressed mood: Secondary | ICD-10-CM | POA: Diagnosis not present

## 2018-08-17 DIAGNOSIS — Z30431 Encounter for routine checking of intrauterine contraceptive device: Secondary | ICD-10-CM | POA: Diagnosis not present

## 2018-08-30 DIAGNOSIS — F4323 Adjustment disorder with mixed anxiety and depressed mood: Secondary | ICD-10-CM | POA: Diagnosis not present

## 2018-09-03 ENCOUNTER — Ambulatory Visit
Admission: RE | Admit: 2018-09-03 | Discharge: 2018-09-03 | Disposition: A | Discharge status: Home / Self Care | Payer: BLUE CROSS/BLUE SHIELD | Source: Ambulatory Visit | Attending: Family Medicine | Admitting: Family Medicine

## 2018-09-03 ENCOUNTER — Other Ambulatory Visit: Payer: Self-pay | Admitting: Family Medicine

## 2018-09-03 DIAGNOSIS — M545 Low back pain, unspecified: Secondary | ICD-10-CM

## 2018-09-19 DIAGNOSIS — F4323 Adjustment disorder with mixed anxiety and depressed mood: Secondary | ICD-10-CM | POA: Diagnosis not present

## 2018-10-03 DIAGNOSIS — F4323 Adjustment disorder with mixed anxiety and depressed mood: Secondary | ICD-10-CM | POA: Diagnosis not present

## 2018-10-15 DIAGNOSIS — Z6839 Body mass index (BMI) 39.0-39.9, adult: Secondary | ICD-10-CM | POA: Diagnosis not present

## 2018-10-15 DIAGNOSIS — F900 Attention-deficit hyperactivity disorder, predominantly inattentive type: Secondary | ICD-10-CM | POA: Diagnosis not present

## 2018-10-15 DIAGNOSIS — F330 Major depressive disorder, recurrent, mild: Secondary | ICD-10-CM | POA: Diagnosis not present

## 2018-10-15 DIAGNOSIS — G47419 Narcolepsy without cataplexy: Secondary | ICD-10-CM | POA: Diagnosis not present

## 2018-10-19 DIAGNOSIS — F4323 Adjustment disorder with mixed anxiety and depressed mood: Secondary | ICD-10-CM | POA: Diagnosis not present

## 2018-11-13 DIAGNOSIS — F4323 Adjustment disorder with mixed anxiety and depressed mood: Secondary | ICD-10-CM | POA: Diagnosis not present

## 2018-11-16 DIAGNOSIS — Z1231 Encounter for screening mammogram for malignant neoplasm of breast: Secondary | ICD-10-CM | POA: Diagnosis not present

## 2018-11-27 DIAGNOSIS — F4323 Adjustment disorder with mixed anxiety and depressed mood: Secondary | ICD-10-CM | POA: Diagnosis not present

## 2018-12-06 ENCOUNTER — Telehealth (HOSPITAL_COMMUNITY): Payer: Self-pay | Admitting: Psychiatry

## 2018-12-06 ENCOUNTER — Inpatient Hospital Stay (HOSPITAL_COMMUNITY)
Admission: RE | Admit: 2018-12-06 | Discharge: 2018-12-09 | DRG: 885 | Disposition: A | Discharge status: Home / Self Care | Payer: BLUE CROSS/BLUE SHIELD | Attending: Psychiatry | Admitting: Psychiatry

## 2018-12-06 ENCOUNTER — Encounter (HOSPITAL_COMMUNITY): Payer: Self-pay

## 2018-12-06 ENCOUNTER — Other Ambulatory Visit: Payer: Self-pay | Admitting: Registered Nurse

## 2018-12-06 ENCOUNTER — Other Ambulatory Visit: Payer: Self-pay

## 2018-12-06 DIAGNOSIS — F411 Generalized anxiety disorder: Secondary | ICD-10-CM | POA: Diagnosis not present

## 2018-12-06 DIAGNOSIS — G47419 Narcolepsy without cataplexy: Secondary | ICD-10-CM | POA: Diagnosis present

## 2018-12-06 DIAGNOSIS — F332 Major depressive disorder, recurrent severe without psychotic features: Principal | ICD-10-CM | POA: Diagnosis present

## 2018-12-06 DIAGNOSIS — Z79899 Other long term (current) drug therapy: Secondary | ICD-10-CM | POA: Diagnosis not present

## 2018-12-06 DIAGNOSIS — F909 Attention-deficit hyperactivity disorder, unspecified type: Secondary | ICD-10-CM | POA: Diagnosis not present

## 2018-12-06 DIAGNOSIS — F322 Major depressive disorder, single episode, severe without psychotic features: Secondary | ICD-10-CM | POA: Diagnosis not present

## 2018-12-06 DIAGNOSIS — R45851 Suicidal ideations: Secondary | ICD-10-CM | POA: Diagnosis not present

## 2018-12-06 DIAGNOSIS — G4700 Insomnia, unspecified: Secondary | ICD-10-CM | POA: Diagnosis present

## 2018-12-06 DIAGNOSIS — Z9851 Tubal ligation status: Secondary | ICD-10-CM | POA: Diagnosis not present

## 2018-12-06 DIAGNOSIS — Z888 Allergy status to other drugs, medicaments and biological substances status: Secondary | ICD-10-CM | POA: Diagnosis not present

## 2018-12-06 LAB — COMPREHENSIVE METABOLIC PANEL
ALT: 22 U/L (ref 0–44)
AST: 19 U/L (ref 15–41)
Albumin: 4.3 g/dL (ref 3.5–5.0)
Alkaline Phosphatase: 53 U/L (ref 38–126)
Anion gap: 8 (ref 5–15)
BUN: 21 mg/dL — ABNORMAL HIGH (ref 6–20)
CO2: 27 mmol/L (ref 22–32)
Calcium: 9.2 mg/dL (ref 8.9–10.3)
Chloride: 103 mmol/L (ref 98–111)
Creatinine, Ser: 0.76 mg/dL (ref 0.44–1.00)
GFR calc Af Amer: >60 mL/min (ref >60)
GFR calc non Af Amer: >60 mL/min (ref >60)
Glucose, Bld: 130 mg/dL — ABNORMAL HIGH (ref 70–99)
Potassium: 3.7 mmol/L (ref 3.5–5.1)
Sodium: 138 mmol/L (ref 135–145)
Total Bilirubin: 0.6 mg/dL (ref 0.3–1.2)
Total Protein: 7.5 g/dL (ref 6.5–8.1)

## 2018-12-06 LAB — CBC
HCT: 45.8 % (ref 36.0–46.0)
Hemoglobin: 14.6 g/dL (ref 12.0–15.0)
MCH: 27.3 pg (ref 26.0–34.0)
MCHC: 31.9 g/dL (ref 30.0–36.0)
MCV: 85.6 fL (ref 80.0–100.0)
Platelets: 354 10*3/uL (ref 150–400)
RBC: 5.35 MIL/uL — ABNORMAL HIGH (ref 3.87–5.11)
RDW: 12.9 % (ref 11.5–15.5)
WBC: 10.2 10*3/uL (ref 4.0–10.5)
nRBC: 0 % (ref 0.0–0.2)

## 2018-12-06 LAB — LIPID PANEL
Cholesterol: 147 mg/dL (ref 0–200)
HDL: 44 mg/dL (ref >40)
LDL Cholesterol: 72 mg/dL (ref 0–99)
Total CHOL/HDL Ratio: 3.3 RATIO
Triglycerides: 154 mg/dL — ABNORMAL HIGH (ref <150)
VLDL: 31 mg/dL (ref 0–40)

## 2018-12-06 LAB — HEMOGLOBIN A1C
Hgb A1c MFr Bld: 5.6 % (ref 4.8–5.6)
Mean Plasma Glucose: 114.02 mg/dL

## 2018-12-06 LAB — ETHANOL: Alcohol, Ethyl (B): <10 mg/dL (ref <10)

## 2018-12-06 MED ORDER — ACETAMINOPHEN 325 MG PO TABS
650.0000 mg | ORAL_TABLET | Freq: Four times a day (QID) | ORAL | Status: DC | PRN
  Administered 2018-12-07 – 2018-12-09 (×2): 650 mg via ORAL
  Filled 2018-12-06 (×2): qty 2

## 2018-12-06 MED ORDER — TRAZODONE HCL 50 MG PO TABS
50.0000 mg | ORAL_TABLET | Freq: Every evening | ORAL | Status: DC | PRN
  Administered 2018-12-06 (×2): 50 mg via ORAL
  Filled 2018-12-06 (×12): qty 1

## 2018-12-06 MED ORDER — ALUM & MAG HYDROXIDE-SIMETH 200-200-20 MG/5ML PO SUSP: 30.0000 mL | Freq: Four times a day (QID) | ORAL | Status: DC | PRN

## 2018-12-06 NOTE — Progress Notes (Signed)
[facility] Group Notes:  (Nursing/MHT/Case Management/Adjunct)  Date:  12/06/2018  Time:  2045  Type of Therapy:  wrap up group  Participation Level:  Active  Participation Quality:  Appropriate, Attentive, Sharing and Supportive  Affect:  Flat  Cognitive:  Appropriate  Insight:  Improving  Engagement in Group:  Engaged  Modes of Intervention:  Clarification, Education and Support  Summary of Progress/Problems: Pt shared that her good for the day was that she finally has her moms support for treatment. If pt could change any one thing it would be to have had her brother arrested 3 years ago and maybe it would have saved his life from heroin addiction. Pt shares that she is grateful for her family.   Tara Vega, Tara Vega S 12/06/2018, 10:00 PM

## 2018-12-06 NOTE — Tx Team (Signed)
Initial Treatment Plan 12/06/2018 4:00 PM Tara Vega ZOX:[id number]    PATIENT STRESSORS: Legal issue Traumatic event   PATIENT STRENGTHS: Ability for insight Capable of independent living Communication skills General fund of knowledge Motivation for treatment/growth Physical Health Supportive family/friends Work skills   PATIENT IDENTIFIED PROBLEMS: "cope with everything"  "feel better about myself"  Suicide Risk  Depression               DISCHARGE CRITERIA:  Ability to meet basic life and health needs Adequate post-discharge living arrangements Improved stabilization in mood, thinking, and/or behavior Medical problems require only outpatient monitoring Motivation to continue treatment in a less acute level of care Need for constant or close observation no longer present Reduction of life-threatening or endangering symptoms to within safe limits Safe-care adequate arrangements made Verbal commitment to aftercare and medication compliance  PRELIMINARY DISCHARGE PLAN: Outpatient therapy  PATIENT/FAMILY INVOLVEMENT: This treatment plan has been presented to and reviewed with the patient, Tara ChuteMary F Vega.  The patient and family have been given the opportunity to ask questions and make suggestions.  Ferrel LoganAmanda A Kelci Petrella, RN 12/06/2018, 4:00 PM

## 2018-12-06 NOTE — H&P (Signed)
Behavioral Health Medical Screening Exam  Tara Vega is an 38 y.o. female patient presents to [facility] as walk in with complaints of multiple life stressors, worsening depression, and anxiety.  Suicidal ideation pan to walk into traffic.  Patient unable to contract for  Safety.  Total Time spent with patient: 30 minutes  Psychiatric Specialty Exam: Physical Exam  ROS  There were no vitals taken for this visit.There is no height or weight on file to calculate BMI.  General Appearance: Casual  Eye Contact:  Good  Speech:  Clear and Coherent and Normal Rate  Volume:  Normal  Mood:  Anxious, Depressed and Hopeless  Affect:  Depressed and Tearful  Thought Process:  Coherent and Disorganized  Orientation:  Full (Time, Place, and Person)  Thought Content:  Logical  Suicidal Thoughts:  Yes.  with intent/plan  Homicidal Thoughts:  No  Memory:  Immediate;   Good Recent;   Good Remote;   Good  Judgement:  Fair  Insight:  Fair  Psychomotor Activity:  Normal  Concentration: Concentration: Fair and Attention Span: Fair  Recall:  Good  Fund of Knowledge:Good  Language: Good  Akathisia:  No  Handed:  Right  AIMS (if indicated):     Assets:  Communication Skills Desire for Improvement Housing Physical Health Social Support  Sleep:       Musculoskeletal: Strength & Muscle Tone: within normal limits Gait & Station: normal Patient leans: N/A  There were no vitals taken for this visit.  Recommendations:  Inpatient psychiatric treatment  Based on my evaluation the patient does not appear to have an emergency medical condition.  Shuvon Rankin, NP 12/06/2018, 12:04 PM

## 2018-12-06 NOTE — Progress Notes (Signed)
Patient ID: Tara Vega, female   DOB: Aug 10, 1981, 38 y.o.   MRN: [id number]  EKG complete and reviewed by provider. Urine specimen cup provided to patient with instruction to return for collection.
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
## 2018-12-06 NOTE — Progress Notes (Signed)
Pt observe attending wrap-up group. Pt appears depressed/anxious in affect and mood. Pt denies SI/HI/AVH/Pain at this time. Pt requesting PRNs for sleep. Provider on call notified; See MAR. Pt oriented to unit rules. Pt state she hopes to talk to provider tomorrow. Pt states she is on Zoloft 100. Pt states she had taken Prozac in the past and it was not effective. Support offered. Will continue with POC.
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
## 2018-12-07 DIAGNOSIS — F322 Major depressive disorder, single episode, severe without psychotic features: Secondary | ICD-10-CM

## 2018-12-07 LAB — RAPID URINE DRUG SCREEN, HOSP PERFORMED
Amphetamines: POSITIVE — AB
Barbiturates: NOT DETECTED
Benzodiazepines: NOT DETECTED
Cocaine: NOT DETECTED
Opiates: NOT DETECTED
Tetrahydrocannabinol: POSITIVE — AB

## 2018-12-07 MED ORDER — HYDROXYZINE HCL 25 MG PO TABS
25.0000 mg | ORAL_TABLET | Freq: Three times a day (TID) | ORAL | Status: DC | PRN
  Administered 2018-12-09: 25 mg via ORAL
  Filled 2018-12-07: qty 1

## 2018-12-07 MED ORDER — BUSPIRONE HCL 15 MG PO TABS
15.0000 mg | ORAL_TABLET | Freq: Three times a day (TID) | ORAL | Status: DC
  Administered 2018-12-07 – 2018-12-09 (×7): 15 mg via ORAL
  Filled 2018-12-07 (×12): qty 1

## 2018-12-07 MED ORDER — AMPHETAMINE-DEXTROAMPHETAMINE 10 MG PO TABS
15.0000 mg | ORAL_TABLET | Freq: Every day | ORAL | Status: DC
  Administered 2018-12-08 – 2018-12-09 (×2): 15 mg via ORAL
  Filled 2018-12-07 (×2): qty 2

## 2018-12-07 MED ORDER — PRENATAL MULTIVITAMIN CH
1.0000 | ORAL_TABLET | Freq: Every day | ORAL | Status: DC
  Administered 2018-12-07 – 2018-12-09 (×3): 1 via ORAL
  Filled 2018-12-07 (×4): qty 1

## 2018-12-07 MED ORDER — ZOLPIDEM TARTRATE 5 MG PO TABS
10.0000 mg | ORAL_TABLET | Freq: Every day | ORAL | Status: DC
  Administered 2018-12-07 – 2018-12-08 (×2): 10 mg via ORAL
  Filled 2018-12-07 (×2): qty 2

## 2018-12-07 MED ORDER — AMPHETAMINE-DEXTROAMPHETAMINE 10 MG PO TABS
10.0000 mg | ORAL_TABLET | Freq: Every day | ORAL | Status: DC
  Administered 2018-12-07 – 2018-12-09 (×3): 10 mg via ORAL
  Filled 2018-12-07 (×3): qty 1

## 2018-12-07 MED ORDER — VORTIOXETINE HBR 10 MG PO TABS
10.0000 mg | ORAL_TABLET | Freq: Every day | ORAL | Status: DC
  Administered 2018-12-07 – 2018-12-09 (×3): 10 mg via ORAL
  Filled 2018-12-07 (×5): qty 1

## 2018-12-07 NOTE — [facility] Suicide Risk Assessment (Signed)
[facility] INPATIENT:  Family/Significant Other Suicide Prevention Education  Suicide Prevention Education:  Patient Refusal for Family/Significant Other Suicide Prevention Education: The patient Tara Vega has refused to provide written consent for family/significant other to be provided Family/Significant Other Suicide Prevention Education during admission and/or prior to discharge.  Physician notified.  SPE completed with pt, as pt refused to consent to family contact. SPI pamphlet provided to pt and pt was encouraged to share information with support network, ask questions, and talk about any concerns relating to SPE. Pt denies access to guns/firearms and verbalized understanding of information provided. Mobile Crisis information also provided to pt.   Rona Ravens LCSW 12/07/2018, 11:45 AM

## 2018-12-07 NOTE — Progress Notes (Signed)
Pt attended AA group this evening.  
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
## 2018-12-07 NOTE — [facility] Group Notes (Signed)
LCSW Group Therapy Note  12/07/2018 12:39 PM  Type of Therapy and Topic: Group Therapy: Avoiding Self-Sabotaging and Enabling Behaviors  Participation Level: Active  Description of Group:  In this group, patients will learn how to identify obstacles, self-sabotaging and enabling behaviors, as well as: what are they, why do we do them and what needs these behaviors meet. Discuss unhealthy relationships and how to have positive healthy boundaries with those that sabotage and enable. Explore aspects of self-sabotage and enabling in yourself and how to limit these self-destructive behaviors in everyday life.  Therapeutic Goals: 1. Patient will identify one obstacle that relates to self-sabotage and enabling behaviors 2. Patient will identify one personal self-sabotaging or enabling behavior they did prior to admission 3. Patient will state a plan to change the above identified behavior 4. Patient will demonstrate ability to communicate their needs through discussion and/or role play.   Summary of Patient Progress:  Patient appropriately listened and participated in group. Patient identified putting others before herself and toxic relationships as a self sabotaging behavior.    Therapeutic Modalities:  Cognitive Behavioral Therapy Person-Centered Therapy Motivational Interviewing  Tara Vega, MSW, [employer]CSWA Clinical Social Worker

## 2018-12-07 NOTE — Progress Notes (Signed)
Recreation Therapy Notes  Date:  1.31.20 Time: 0930 Location: 300 Hall Dayroom  Group Topic: Stress Management  Goal Area(s) Addresses:  Patient will identify positive stress management techniques. Patient will identify benefits of using stress management post d/c.  Behavioral Response: Engaged  Intervention: Stress Management  Activity :  Progressive Muscle Relaxation.  LRT introduced the stress management technique of progressive muscle relaxation.  LRT read a script that guided patients through the process of tensing and relaxing each muscle group individually.  Patients were to follow along as script was read to engage in activity.  Education:  Stress Management, Discharge Planning.   Education Outcome: Acknowledges Education  Clinical Observations/Feedback:  Pt attended and participated in activity.     Tara Vega, LRT/CTRS         Tara Vega A 12/07/2018 11:07 AM 
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
## 2018-12-07 NOTE — Progress Notes (Signed)
Patient ID: Tara Vega, female   DOB: [birth year], 38 y.o.   MRN: [id number]  Nursing Progress Note [id number]  Data: On initial approach, patient is seen sitting up in the dayroom. Patient presents depressed but does brighten during interactions. Patient denies pain/physical complaints. Patient completed self-inventory sheet and rates depression, hopelessness, and anxiety 3,2,4 respectively. Patient rates their sleep and appetite as good/good respectively. Patient states goal for today is to "ask for help more often, express my feelings". Patient is seen attending groups and visible in the milieu. Patient currently denies SI/HI/AVH.   Action: Patient is educated about and provided medication per provider's orders. Patient safety maintained with q15 min safety checks and frequent rounding. Low fall risk precautions in place. Emotional support given. 1:1 interaction and active listening provided. Patient encouraged to attend meals, groups, and work on treatment plan and goals. Labs, vital signs and patient behavior monitored throughout shift.   Response: Patient remains safe on the unit at this time and agrees to come to staff with any issues/concerns. Patient is interacting with peers appropriately on the unit. Will continue to support and monitor.
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
## 2018-12-07 NOTE — Progress Notes (Signed)
  [facility] Adult Case Management Discharge Plan :  Will you be returning to the same living situation after discharge:  Yes,  home At discharge, do you have transportation home?: Yes,  family member will pick her up on Sunday, 2/2. Do you have the ability to pay for your medications: Yes,  private insurance  Release of information consent forms completed and submitted to medical records by CSW.   Patient to Follow up at: Follow-up Information    Coun[facility]ing, [facility] Follow up on 12/12/2018.   Why:  Therapy appointment with Marisue Ivan is 2/5 at 4:30pm. Please let her know that you are interested in seeing her once weekly.  Contact information: [address] [place] [address] [phone]        Lewis Moccasin, MD Follow up.   Specialty:  Family Medicine Why:  Your new insurance is currently out of network with the office and they are unable to schedule an appt. Please call the office to follow-up with this issue if you are still interested in seeing this provider for medication management. Thank you.  Contact information: [address] [place] [address] [phone]        [facility] Follow up.   Specialty:  Behavioral Health Why:  Social worker will contact you on Monday with appt date/time for medication management. Thank you.   Contact information: [address] [place] [address] [phone]          Next level of care provider has access to [facility] Link:no  Safety Planning and Suicide Prevention discussed: Yes,  SPE completed with pt; pt declined to consent to collateral contact. SPI pamphlet and mobile crisis information provided.   Have you used any form of tobacco in the last 30 days? (Cigarettes, Smokeless Tobacco, Cigars, and/or Pipes): No  Has patient been referred to the Quitline?: N/A patient is not a smoker  Patient has been referred for addiction treatment:  yes  Rona Ravens, LCSW 12/07/2018, 2:46 PM

## 2018-12-07 NOTE — Progress Notes (Signed)
Pt observe attending wrap-up group. Pt appears animated/anxious in affect and mood. Pt denies SI/HI/AVH/Pain at this time. Pt states she had a good day. Pt was started on new medications for depression and insomnia. Pt states she felt hungover this a.m. from taking trazodone. Support offered. Will continue with POC

## 2018-12-08 NOTE — Plan of Care (Signed)
  Problem: Coping: Goal: Ability to verbalize frustrations and anger appropriately will improve Outcome: Progressing   D: Pt alert and oriented on the unit. Pt denies SI/HI, A/VH. Pt rated her her depression and anxiety a 1, and feeling of hopelessness a 0, all on a scale of 0 to 10 with 10 being the worst. Pt's stated that her goal for today is to work on "self love." Pt is cooperative on the unit. A: Education, support and encouragement provided, q15 minute safety checks remain in effect. Medications administered per MD orders. R: No reactions/side effects to medicine noted. Pt denies any concerns at this time, and verbally contracts for safety. Pt ambulating on the unit with no issues. Pt remains safe on and off the unit.

## 2018-12-08 NOTE — [facility] Group Notes (Signed)
LCSW Group Therapy Note  Date/Time:  12/08/2018   9:00AM-10:00AM  Type of Therapy and Topic:  Group Therapy:  Fears and Unhealthy/Healthy Coping Skills  Participation Level:  Active   Description of Group:  The focus of this group was to discuss some of the prevalent fears that patients experience, and to identify the commonalities among group members.  A fun exercise was used to initiate the discussion, followed by writing on the white board a group-generated list of unhealthy coping and healthy coping techniques to deal with each fear.    Therapeutic Goals: 1. Patient will be able to distinguish between healthy and unhealthy coping skills 2. Patient will identify and describe 3 fears they experience 3. Patient will identify one positive coping strategy for each fear they experience 4. Patient will respond empathetically to peers' statements regarding fears they experience  Summary of Patient Progress:  The patient expressed that she fears not being good enough for her children and family.  She was very willing to participate in a lengthy mindfulness exercise that CSW led and which she said was helpful.  She said her sister-in-law has agreed to be a supporter and she intends to reach out to her for support.  She also prays a great deal.  Therapeutic Modalities Cognitive Behavioral Therapy Motivational Interviewing  Ambrose Mantle, LCSW

## 2018-12-08 NOTE — Progress Notes (Signed)
Pt observe attending wrap-up group. Pt appears animated/anxious in affect and mood. Pt denies SI/HI/AVH/Pain at this time. Pt rates good sleep from [place]. Support offered. Will continue with POC.

## 2018-12-08 NOTE — Progress Notes (Signed)
[facility] MD Progress Note  12/08/2018 1:29 PM [name]  MRN:  [id number]  Subjective: Tara Vega reports; "I have been here x 2 days because I was having a mental breakdown. I lost my job last year, taking care of 3 kids. My niece & her family live with me. I have too much going on. It just became too much to bear. Then, my relationship was not going well. I feel really good being here. I'm doing very well on the medicines. No side effects.  Per admission evaluation: [name] is 38 years of age and she presented voluntarily she acknowledged feeling overwhelmed, felt that her antidepressants were not helping the situation, and felt that for the first time in 5 years she had suicidal thinking and it was time to get help. She reports multiple stressors to include raising 4 children, the adoptive children from her brother and her own children, she states that Zoloft has been prescribed for prolonged period of time when she misses doses she gets irritable but does not feel it is helping her at this point. She is not sleeping well, she has lost a little bit of weight, she is also resume skin picking and has some lesions on her face. Denied substance abuse but drug screen shows cannabis.   Objective: [name] is seen, chart reviewed. The chart findings discussed with the treatment team. She presents alert, oriented & aware of situation. She is visible on the unit, attending group sessions. She is taking & tolerating her treatment regimen. She denies any SIHI, AVH, delusional thoughts or paranoia. She does not appear to be responding to any internal stimuli. [name] has agreed to continue current plan of care as already in progress.  Principal Problem: MDD (major depressive disorder), severe (HCC)  Diagnosis: Principal Problem:   MDD (major depressive disorder), severe (HCC)  Total Time spent with patient: Greater than 30 minutes  Past Psychiatric History: See H&P  Past Medical History:  Past Medical History:   Diagnosis Date  . Carpal tunnel syndrome, right 10/2012  . Headache(784.0)    tension; migraines 1-2 x/month    Past Surgical History:  Procedure Laterality Date  . ANKLE FRACTURE SURGERY    . APPENDECTOMY  02/26/2008   laparoscopic  . CARPAL TUNNEL RELEASE  10/11/2012   Procedure: CARPAL TUNNEL RELEASE;  Surgeon: Dominica Severin, MD;  Location:  SURGERY CENTER;  Service: Orthopedics;  Laterality: Right;  . TUBAL LIGATION  01/27/2011   laparoscopic   Family History: History reviewed. No pertinent family history. Family Psychiatric  History: See H&P.  Social History:  Social History   Substance and Sexual Activity  Alcohol Use No     Social History   Substance and Sexual Activity  Drug Use No    Social History   Socioeconomic History  . Marital status: Single    Spouse name: Not on file  . Number of children: Not on file  . Years of education: Not on file  . Highest education level: Not on file  Occupational History  . Not on file  Social Needs  . Financial resource strain: Not on file  . Food insecurity:    Worry: Not on file    Inability: Not on file  . Transportation needs:    Medical: Not on file    Non-medical: Not on file  Tobacco Use  . Smoking status: Never Smoker  . Smokeless tobacco: Never Used  Substance and Sexual Activity  . Alcohol use: No  .  Drug use: No  . Sexual activity: Not Currently  Lifestyle  . Physical activity:    Days per week: Not on file    Minutes per session: Not on file  . Stress: Not on file  Relationships  . Social connections:    Talks on phone: Not on file    Gets together: Not on file    Attends religious service: Not on file    Active member of club or organization: Not on file    Attends meetings of clubs or organizations: Not on file    Relationship status: Not on file  Other Topics Concern  . Not on file  Social History Narrative  . Not on file   Additional Social History:  Pain Medications: see  MAR Prescriptions: see MAR Over the Counter: see MAR History of alcohol / drug use?: No history of alcohol / drug abuse Longest period of sobriety (when/how long): N/A  Sleep: Good  Appetite:  Good  Current Medications: Current Facility-Administered Medications  Medication Dose Route Frequency Provider Last Rate Last Dose  . acetaminophen (TYLENOL) tablet 650 mg  650 mg Oral Q6H PRN Kerry Hough, PA-C   650 mg at 12/07/18 1191  . alum & mag hydroxide-simeth (MAALOX/MYLANTA) 200-200-20 MG/5ML suspension 30 mL  30 mL Oral Q6H PRN Donell Sievert E, PA-C      . amphetamine-dextroamphetamine (ADDERALL) tablet 10 mg  10 mg Oral QPC lunch Malvin Johns, MD   10 mg at 12/08/18 1300  . amphetamine-dextroamphetamine (ADDERALL) tablet 15 mg  15 mg Oral Q breakfast Malvin Johns, MD   15 mg at 12/08/18 4782  . busPIRone (BUSPAR) tablet 15 mg  15 mg Oral TID Malvin Johns, MD   15 mg at 12/08/18 1258  . hydrOXYzine (ATARAX/VISTARIL) tablet 25 mg  25 mg Oral TID PRN Malvin Johns, MD      . prenatal multivitamin tablet 1 tablet  1 tablet Oral Q1200 Malvin Johns, MD   1 tablet at 12/08/18 1258  . traZODone (DESYREL) tablet 50 mg  50 mg Oral QHS,MR X 1 Kerry Hough, PA-C   50 mg at 12/06/18 2137  . vortioxetine HBr (TRINTELLIX) tablet 10 mg  10 mg Oral Daily Malvin Johns, MD   10 mg at 12/08/18 9562  . zolpidem (AMBIEN) tablet 10 mg  10 mg Oral QHS Malvin Johns, MD   10 mg at 12/07/18 2133   Lab Results:  Results for orders placed or performed during the hospital encounter of 12/06/18 (from the past 48 hour(s))  Rapid urine drug screen (hospital performed)     Status: Abnormal   Collection Time: 12/06/18  4:14 PM  Result Value Ref Range   Opiates NONE DETECTED NONE DETECTED   Cocaine NONE DETECTED NONE DETECTED   Benzodiazepines NONE DETECTED NONE DETECTED   Amphetamines POSITIVE (A) NONE DETECTED   Tetrahydrocannabinol POSITIVE (A) NONE DETECTED   Barbiturates NONE DETECTED NONE DETECTED     Comment: (NOTE) DRUG SCREEN FOR MEDICAL PURPOSES ONLY.  IF CONFIRMATION IS NEEDED FOR ANY PURPOSE, NOTIFY LAB WITHIN 5 DAYS. LOWEST DETECTABLE LIMITS FOR URINE DRUG SCREEN Drug Class                     Cutoff (ng/mL) Amphetamine and metabolites    1000 Barbiturate and metabolites    200 Benzodiazepine                 200 Tricyclics and metabolites     300 Opiates  and metabolites        300 Cocaine and metabolites        300 THC                            50 Performed at [facility], [address] [place], [place] [address]   CBC     Status: Abnormal   Collection Time: 12/06/18  6:31 PM  Result Value Ref Range   WBC 10.2 4.0 - 10.5 K/uL   RBC 5.35 (H) 3.87 - 5.11 MIL/uL   Hemoglobin 14.6 12.0 - 15.0 g/dL   HCT 11.5 72.6 - 20.3 %   MCV 85.6 80.0 - 100.0 fL   MCH 27.3 26.0 - 34.0 pg   MCHC 31.9 30.0 - 36.0 g/dL   RDW 55.9 74.1 - 63.8 %   Platelets 354 150 - 400 K/uL   nRBC 0.0 0.0 - 0.2 %    Comment: Performed at [facility], [address], [place], [place] [address]  Comprehensive metabolic panel     Status: Abnormal   Collection Time: 12/06/18  6:31 PM  Result Value Ref Range   Sodium 138 135 - 145 mmol/L   Potassium 3.7 3.5 - 5.1 mmol/L   Chloride 103 98 - 111 mmol/L   CO2 27 22 - 32 mmol/L   Glucose, Bld 130 (H) 70 - 99 mg/dL   BUN 21 (H) 6 - 20 mg/dL   Creatinine, Ser 6.80 0.44 - 1.00 mg/dL   Calcium 9.2 8.9 - 32.1 mg/dL   Total Protein 7.5 6.5 - 8.1 g/dL   Albumin 4.3 3.5 - 5.0 g/dL   AST 19 15 - 41 U/L   ALT 22 0 - 44 U/L   Alkaline Phosphatase 53 38 - 126 U/L   Total Bilirubin 0.6 0.3 - 1.2 mg/dL   GFR calc non Af Amer >60 >60 mL/min   GFR calc Af Amer >60 >60 mL/min   Anion gap 8 5 - 15    Comment: Performed at [facility], [address] [place], [place] [address]  Ethanol     Status: None   Collection Time: 12/06/18  6:31 PM  Result Value Ref Range   Alcohol, Ethyl (B) <10 <10 mg/dL     Comment: (NOTE) Lowest detectable limit for serum alcohol is 10 mg/dL. For medical purposes only. Performed at [facility], [address] [place], [place] [address]   Hemoglobin A1c     Status: None   Collection Time: 12/06/18  6:31 PM  Result Value Ref Range   Hgb A1c MFr Bld 5.6 4.8 - 5.6 %    Comment: (NOTE) Pre diabetes:          5.7%-6.4% Diabetes:              >6.4% Glycemic control for   <7.0% adults with diabetes    Mean Plasma Glucose 114.02 mg/dL    Comment: Performed at [facility], [address] [place], [place] [address]  Lipid panel     Status: Abnormal   Collection Time: 12/06/18  6:31 PM  Result Value Ref Range   Cholesterol 147 0 - 200 mg/dL   Triglycerides 916 (H) <150 mg/dL   HDL 44 >94 mg/dL   Total CHOL/HDL Ratio 3.3 RATIO   VLDL 31 0 - 40 mg/dL   LDL Cholesterol 72 0 - 99 mg/dL  Comment:        Total Cholesterol/HDL:CHD Risk Coronary Heart Disease Risk Table                     Men   Women  1/2 Average Risk   3.4   3.3  Average Risk       5.0   4.4  2 X Average Risk   9.6   7.1  3 X Average Risk  23.4   11.0        Use the calculated Patient Ratio above and the CHD Risk Table to determine the patient's CHD Risk.        ATP III CLASSIFICATION (LDL):  <100     mg/dL   Optimal  154-008  mg/dL   Near or Above                    Optimal  130-159  mg/dL   Borderline  676-195  mg/dL   High  >093     mg/dL   Very High Performed at [facility], [address], [place], [place] [address]    Blood Alcohol level:  Lab Results  Component Value Date   ETH <10 12/06/2018   Metabolic Disorder Labs: Lab Results  Component Value Date   HGBA1C 5.6 12/06/2018   MPG 114.02 12/06/2018   No results found for: PROLACTIN Lab Results  Component Value Date   CHOL 147 12/06/2018   TRIG 154 (H) 12/06/2018   HDL 44 12/06/2018   CHOLHDL 3.3 12/06/2018   VLDL 31 12/06/2018   LDLCALC 72 12/06/2018    Physical Findings: AIMS: Facial and Oral Movements Muscles of Facial Expression: None, normal Lips and Perioral Area: None, normal Jaw: None, normal Tongue: None, normal,Extremity Movements Upper (arms, wrists, hands, fingers): None, normal Lower (legs, knees, ankles, toes): None, normal, Trunk Movements Neck, shoulders, hips: None, normal, Overall Severity Severity of abnormal movements (highest score from questions above): None, normal Incapacitation due to abnormal movements: None, normal Patient's awareness of abnormal movements (rate only patient's report): No Awareness, Dental Status Current problems with teeth and/or dentures?: No Does patient usually wear dentures?: No  CIWA:    COWS:     Musculoskeletal: Strength & Muscle Tone: within normal limits Gait & Station: normal Patient leans: N/A  Psychiatric Specialty Exam: Physical Exam  Nursing note and vitals reviewed.   Review of Systems  Respiratory: Negative.  Negative for cough and shortness of breath.   Cardiovascular: Negative.  Negative for chest pain and palpitations.    Blood pressure 121/86, pulse 86, temperature 98 F (36.7 C), temperature source Oral, resp. rate 18, height 5\' 1"  (1.549 m), weight 94.3 kg, SpO2 100 %.Body mass index is 39.3 kg/m.  General Appearance: Casual  Eye Contact:  Good  Speech:  Clear and Coherent  Volume:  Normal  Mood:  Dysphoric  Affect:  Appropriate  Thought Process:  Goal Directed  Orientation:  Full (Time, Place, and Person)  Thought Content:  Rumination and Tangential  Suicidal Thoughts:  Yes.  without intent/plan  Homicidal Thoughts:  No  Memory:  Immediate;   Fair  Judgement:  Fair  Insight:  Good  Psychomotor Activity:  Normal  Concentration:  Concentration: Good  Recall:  Good  Fund of Knowledge:  Good  Language:  Good  Akathisia:  Negative  Handed:  Right  AIMS (if indicated):     Assets:  Communication Skills Desire for Improvement Housing Physical  Health Resilience  ADL's:  Intact  Cognition:  WNL  Sleep:  Number of Hours: 6.5     Treatment Plan Summary: Daily contact with patient to assess and evaluate symptoms and progress in treatment and Medication management.      - Continue inpatient hospitalization.      - Will continue today 12/08/2018 plan as below except where it is noted.  ADHD.     - Continue Adderall 15 mg po daily with breakfast.     - Continue Adderall 10 mg po daily after lunch.  Anxiety.     - Continue Buspar 15 mg po tid.     - Continue Vistaril 25 mg po tid prn.  Depression.     - Continue Trintellix 10 mg po daily.      Insomnia.     - Continue Trazodone 50 mg po Q hs prn, may repeat x 1.     - Continue Ambien 10 mg po Q hs.  Patient is enrolled & will attend group sessions.  Discharge disposition in progress.  Armandina StammerAgnes Nishika Parkhurst, NP, PMHNP, FNP-BC 12/08/2018, 1:29 PM

## 2018-12-08 NOTE — BHH Group Notes (Signed)
Adult Psychoeducational Group Note  Date:  12/08/2018 Time:  5:57 PM  Group Topic/Focus:  Healthy Communication:   The focus of this group is to discuss communication, barriers to communication, as well as healthy ways to communicate with others.  Participation Level:  Active  Participation Quality:  Appropriate  Affect:  Appropriate  Cognitive:  Appropriate  Insight: Appropriate  Engagement in Group:  Engaged  Modes of Intervention:  Activity, Discussion, Exploration, Rapport Building, Socialization and Support  Additional Comments:  Pt attended and participated during the group activity.  Tara Vega C 12/08/2018, 5:57 PM  

## 2018-12-08 NOTE — Progress Notes (Signed)
Patient did attend the evening speaker AA meeting.  

## 2018-12-09 MED ORDER — TRAZODONE HCL 50 MG PO TABS: 50.0000 mg | ORAL_TABLET | Freq: Every evening | ORAL | 0 refills | Status: DC | PRN

## 2018-12-09 MED ORDER — BUSPIRONE HCL 15 MG PO TABS: 15.0000 mg | ORAL_TABLET | Freq: Three times a day (TID) | ORAL | 0 refills | Status: DC

## 2018-12-09 MED ORDER — HYDROXYZINE HCL 25 MG PO TABS: 25.0000 mg | ORAL_TABLET | Freq: Three times a day (TID) | ORAL | 0 refills | Status: DC | PRN

## 2018-12-09 MED ORDER — VORTIOXETINE HBR 10 MG PO TABS: 10.0000 mg | ORAL_TABLET | Freq: Every day | ORAL | 0 refills | Status: DC

## 2018-12-09 NOTE — [facility] Suicide Risk Assessment (Signed)
[facility]   Principal Problem: MDD (major depressive disorder), severe (HCC) Discharge Diagnoses: Principal Problem:   MDD (major depressive disorder), severe (HCC)   Total Time spent with patient: 15 minutes  Musculoskeletal: Strength & Muscle Tone: within normal limits Gait & Station: normal Patient leans: N/A  Psychiatric Specialty Exam: Review of Systems  All other systems reviewed and are negative.   Blood pressure (!) 144/97, pulse 81, temperature 97.6 F (36.4 C), temperature source Oral, resp. rate 18, height 5\' 1"  (1.549 m), weight 94.3 kg, SpO2 100 %.Body mass index is 39.3 kg/m.  General Appearance: Casual  Eye Contact::  Fair  Speech:  Normal Rate409  Volume:  Normal  Mood:  Anxious  Affect:  Congruent  Thought Process:  Coherent and Descriptions of Associations: Intact  Orientation:  Full (Time, Place, and Person)  Thought Content:  Logical  Suicidal Thoughts:  No  Homicidal Thoughts:  No  Memory:  Immediate;   Fair Recent;   Fair Remote;   Fair  Judgement:  Fair  Insight:  Fair  Psychomotor Activity:  Normal  Concentration:  Fair  Recall:  [employer]Fair  Fund of Knowledge:Fair  Language: Fair  Akathisia:  Negative  Handed:  Right  AIMS (if indicated):     Assets:  Communication Skills Desire for Improvement Housing Physical Health Resilience  Sleep:  Number of Hours: 5.75  Cognition: WNL  ADL's:  Intact   Mental Status Per Nursing Assessment::   On Admission:  Suicidal ideation indicated by patient, Self-harm thoughts  Demographic Factors:  Caucasian  Loss Factors: NA  Historical Factors: Impulsivity  Risk Reduction Factors:   Responsible for children under 40 years of age, Sense of responsibility to family and Living with another person, especially a relative  Continued Clinical Symptoms:  Depression:   Comorbid alcohol abuse/dependence Impulsivity Alcohol/Substance Abuse/Dependencies  Cognitive Features That  Contribute To Risk:  None    Suicide Risk:  Minimal: No identifiable suicidal ideation.  Patients presenting with no risk factors but with morbid ruminations; may be classified as minimal risk based on the severity of the depressive symptoms  Follow-up Information    [facility] Follow up on 12/12/2018.   Why:  Therapy appointment with Marisue IvanLiz is 2/5 at 4:30pm. Please let her know that you are interested in seeing her once weekly.  Contact information: [address] [place] [address] [phone]        Lewis Moccasinewey, Elizabeth R, MD Follow up.   Specialty:  Family Medicine Why:  Your new insurance is currently out of network with the office and they are unable to schedule an appt. Please call the office to follow-up with this issue if you are still interested in seeing this provider for medication management. Thank you.  Contact information: [address] [place] [id number] [phone]        [facility] Follow up.   Specialty:  Behavioral Health Why:  Social worker will contact you on Monday with appt date/time for medication management. Thank you.   Contact information: [address] [place] [id number] [phone]          Plan Of Care/Follow-up recommendations:  Activity:  ad lib  Antonieta PertGreg Lawson Arda Keadle, MD 12/09/2018, 12:03 PM

## 2018-12-09 NOTE — Progress Notes (Signed)
Patient ID: [name], female   DOB: 11/04/1981, 38 y.o.   MRN: [id number]  D: Pt alert and oriented on the unit.   A: Education, support, and encouragement provided. Discharge summary, medications and follow up appointments reviewed with pt. Suicide prevention resources provided, including "My 3 App." Pt's belongings in locker # 45 returned and belongings sheet signed.  R: Pt denies SI/HI, A/VH, pain, or any concerns at this time. Pt ambulatory on and off unit. Pt discharged to lobby.

## 2018-12-09 NOTE — B[facility] Group Notes (Signed)
B[facility] LCSW Group Therapy Note  12/09/2018   10:00-11:00AM  Type of Therapy and Topic:  Group Therapy:  Unhealthy versus Healthy Supports, Which Am I?  Participation Level:  Active   Description of Group:  Patients in this group were introduced to the concept that additional supports including self-support are an essential part of recovery.  Initially a discussion was held about the differences between healthy versus unhealthy supports.  Patients were asked to share what unhealthy supports in their lives need to be addressed, as well as what additional healthy supports could be added for greater help in reaching their goals.   A song entitled "My Own Hero" was played and a group discussion ensued in which patients stated they could relate to the song and it inspired them to realize they have be willing to help themselves in order to succeed, because other people cannot achieve sobriety or stability for them.  We discussed adding a variety of healthy supports to address the various needs in patient lives, including becoming more self-supportive.  Therapeutic Goals: 1)  Highlight the differences between healthy and unhealthy supports 2)  Suggest the importance of being a part of one's own support system 2)  Discuss reasons people in one's life may eventually be unable to be continually supportive  3)  Identify the patient's current support system and   4)  elicit commitments to add healthy supports and to become more conscious of being self-supportive   Summary of Patient Progress:  The patient expressed that the unhealthy support which needs to be addressed includes her mother who has been unsupportive her whole life and "needs to do more."  Healthy supports which could be added for increased stability and happiness include her sister-in-law, professional support, and a therapist.  Therapeutic Modalities:   Motivational Interviewing Activity  Lynnell Chad
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
## 2018-12-09 NOTE — [facility] Group Notes (Signed)
[facility] Group Notes:  (Nursing/MHT/Case Management/Adjunct)  Date:  12/09/2018  Time:  1:55 PM  Type of Therapy:  Psychoeducational Skills  Participation Level:  Active  Participation Quality:  Appropriate  Affect:  Appropriate  Cognitive:  Appropriate  Insight:  Appropriate  Engagement in Group:  Engaged  Modes of Intervention:  Problem-solving  Summary of Progress/Problems: Pt attended Psychoeducational group with the topic healthy support systems.    [name] 12/09/2018, 1:55 PM

## 2018-12-12 ENCOUNTER — Ambulatory Visit (INDEPENDENT_AMBULATORY_CARE_PROVIDER_SITE_OTHER): Payer: BLUE CROSS/BLUE SHIELD | Admitting: Psychiatry

## 2018-12-12 ENCOUNTER — Encounter (HOSPITAL_COMMUNITY): Payer: Self-pay | Admitting: Psychiatry

## 2018-12-12 VITALS — BP 122/80 | Ht 61.0 in | Wt 215.0 lb

## 2018-12-12 DIAGNOSIS — F331 Major depressive disorder, recurrent, moderate: Secondary | ICD-10-CM | POA: Diagnosis not present

## 2018-12-12 DIAGNOSIS — F4323 Adjustment disorder with mixed anxiety and depressed mood: Secondary | ICD-10-CM | POA: Diagnosis not present

## 2018-12-12 DIAGNOSIS — G47419 Narcolepsy without cataplexy: Secondary | ICD-10-CM | POA: Diagnosis not present

## 2018-12-12 MED ORDER — BUSPIRONE HCL 15 MG PO TABS: 15.0000 mg | ORAL_TABLET | Freq: Three times a day (TID) | ORAL | 1 refills | Status: AC

## 2018-12-12 MED ORDER — ZOLPIDEM TARTRATE 5 MG PO TABS: 5.0000 mg | ORAL_TABLET | Freq: Every evening | ORAL | 0 refills | Status: DC | PRN

## 2018-12-12 NOTE — Patient Instructions (Addendum)
Plan:  1. Stop trazodone as it is too sedating.  2. Use Ambien instead as needed for insomnia.  3. Stop hydroxyzine (Vistaril) as it is too sedating.  4. Continue Buspar as prescribed.  5. Continue Adderall for narcolepsy.  6. We will attempt to have Trintellix approved and will let you know as soon as it is done so you can have your Rx filled.

## 2018-12-12 NOTE — Progress Notes (Signed)
Psychiatric Initial Adult Assessment   Patient Identification: Tara Vega MRN:  [id number] Date of Evaluation:  12/12/2018 Referral Source: Follow up after discharge from [facility]. Chief Complaint:  "I feel much better" Visit Diagnosis:    ICD-10-CM   1. Moderate episode of recurrent major depressive disorder (HCC) F33.1   2. Primary narcolepsy without cataplexy G47.419     History of Present Illness:  38 yo single female discharged 3 days ago from [facility]. She was admitted after stating that she is severely depressed, anxious and having suicidal thoughts. 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 Patient has a long hx of depression and anxiety and has been initially on fluoxetine and then, for many years, on sertraline. She noticed that it no longer seems to work. # years ago she experienced another depressive break, had suicidal thoughts of walking into traffic but did not do that. She was not hospitalized then. [facility] stay was her fist inpatient admission. Patient has been started on 10 mg of vortioxetine while in hospital. She tolerates it well and hopes to stay on it but she needs prior authorization and could not fill Rx for it so far. She was also started on buspirone 15 mg tid for anxiety, trazodone 50 mg for insomnia and hydroxyzine 25 mg tid prn anxiety. These last two medications make her excessively sedated and she cannot work if she takes them. While on the unit she was tried on zolpidem for sleep and it worked well. May denies having hx of mania or psychosis. She denies abusing alcohol or drugs. She tested positive for cannabis and admits to using it once 3 weeks ago. She is prescribed Adderall  for narcolepsy (without cataplexy).  For other details please see admission H&P.  Associated Signs/Symptoms: Depression Symptoms:  depressed mood, insomnia, anxiety, panic attacks, loss of energy/fatigue, (Hypo) Manic Symptoms:  none Anxiety Symptoms:  Excessive Worry, Panic Symptoms, Psychotic Symptoms:  None PTSD Symptoms: Negative  Past Psychiatric History: see above  Previous Psychotropic Medications: Yes   Substance Abuse History in the last 12 months:  No.  Consequences of Substance Abuse: Negative  Past Medical History:  Past Medical History:  Diagnosis Date  . Carpal tunnel syndrome, right 10/2012  . Headache(784.0)    tension; migraines 1-2 x/month    Past Surgical History:  Procedure Laterality Date  . ANKLE FRACTURE SURGERY    . APPENDECTOMY  02/26/2008   laparoscopic  . CARPAL TUNNEL RELEASE  10/11/2012   Procedure: CARPAL TUNNEL RELEASE;  Surgeon: Dominica Severin, MD;  Location: [facility];  Service: Orthopedics;  Laterality: Right;  . TUBAL LIGATION  01/27/2011   laparoscopic    Family Psychiatric History: brother opioid addiction  Family History: History reviewed. No pertinent family history.  Social History:   Social History   Socioeconomic History  . Marital status: Single    Spouse name: Not on file  . Number of children: Not on file  . Years of education: Not on file  . Highest education level: Not on file  Occupational History  . Not on file  Social Needs  . Financial resource strain: Not on file  .  Food insecurity:    Worry: Not on file    Inability: Not on file  . Transportation needs:    Medical: Not on file    Non-medical: Not on file  Tobacco Use  . Smoking status: Never Smoker  . Smokeless tobacco: Never Used  Substance and Sexual Activity  . Alcohol use: No  . Drug use: No  . Sexual activity: Not Currently  Lifestyle  . Physical activity:    Days per week: Not on file    Minutes per session: Not on file   . Stress: Not on file  Relationships  . Social connections:    Talks on phone: Not on file    Gets together: Not on file    Attends religious service: Not on file    Active member of club or organization: Not on file    Attends meetings of clubs or organizations: Not on file    Relationship status: Not on file  Other Topics Concern  . Not on file  Social History Narrative  . Not on file    Additional Social History: Employed in outside Airline pilotsales. Lives with 3 children cousin, nephew and his wife.  Allergies:   Allergies  Allergen Reactions  . Aspirin Other (See Comments)    NOSEBLEEDS    Metabolic Disorder Labs: Lab Results  Component Value Date   HGBA1C 5.6 12/06/2018   MPG 114.02 12/06/2018   No results found for: PROLACTIN Lab Results  Component Value Date   CHOL 147 12/06/2018   TRIG 154 (H) 12/06/2018   HDL 44 12/06/2018   CHOLHDL 3.3 12/06/2018   VLDL 31 12/06/2018   LDLCALC 72 12/06/2018   No results found for: TSH  Therapeutic Level Labs: No results found for: LITHIUM No results found for: CBMZ No results found for: VALPROATE  Current Medications: Current Outpatient Medications  Medication Sig Dispense Refill  . amphetamine-dextroamphetamine (ADDERALL) 10 MG tablet Take 10-20 mg by mouth 2 (two) times daily. Take 2 tablets (20mg ) qam and take 1 tablet (10mg ) qpm    . busPIRone (BUSPAR) 15 MG tablet Take 1 tablet (15 mg total) by mouth 3 (three) times daily for 30 days. 90 tablet 1  . vortioxetine HBr (TRINTELLIX) 10 MG TABS tablet Take 1 tablet (10 mg total) by mouth daily. 30 tablet 0  . zolpidem (AMBIEN) 5 MG tablet Take 1 tablet (5 mg total) by mouth at bedtime as needed for sleep. 30 tablet 0   No current facility-administered medications for this visit.     Musculoskeletal: Strength & Muscle Tone: within normal limits Gait & Station: normal Patient leans: N/A  Psychiatric Specialty Exam: ROS  Blood pressure 122/80, height 5\' 1"  (1.549 m),  weight 215 lb (97.5 kg).Body mass index is 40.62 kg/m.  General Appearance: Casual and Well Groomed  Eye Contact:  Good  Speech:  Clear and Coherent  Volume:  Normal  Mood:  Anxious and Depressed  Affect:  Non-Congruent and Full Range  Thought Process:  Goal Directed  Orientation:  Full (Time, Place, and Person)  Thought Content:  Logical  Suicidal Thoughts:  No  Homicidal Thoughts:  No  Memory:  Immediate;   Good Recent;   Good Remote;   Good  Judgement:  Fair  Insight:  Fair  Psychomotor Activity:  Normal  Concentration:  Concentration: Good and Attention Span: Good  Recall:  Good  Fund of Knowledge:Good  Language: Good  Akathisia:  Negative  Handed:  Right  AIMS (if indicated):  not done  Assets:  Communication Skills Desire for Improvement Housing Physical Health Talents/Skills  ADL's:  Intact  Cognition: WNL  Sleep:  Fair   Screenings: AIMS     Admission (Discharged) [facility]  AIMS Total Score  0    AUDIT     Admission (Discharged) from [facility]  Alcohol Use Disorder Identification Test Final Score (AUDIT)  0      Assessment and Plan: 38 yo single female [facility] She was admitted after stating that she is severely depressed, anxious and having suicidal thoughts. 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 Patient has a long hx of depression and anxiety and has been initially on fluoxetine and then, for many years, on sertraline. Medications have been changed while in hospital. Her mood noticeably improved but some meds  were too sedating (trazodone, hydroxyzine). She also needs prior authorization for Trintellix.  Diagnostic impression: MDD recurrent moderate to mild; Narcolepsy w/o cataplexy  Plan: 1. Stop trazodone as it is too sedating.  2. Use Ambien instead as needed for insomnia.  3. Stop hydroxyzine (Vistaril) as it is too sedating.  4. Continue Buspar as prescribed.  5. Continue Adderall for narcolepsy.  6. We will attempt to have Trintellix approved and will let you know as soon as it is done so you can have your Rx filled. The plan was discussed with patient. I spend 45 minutes in direct face to face clinical contact with the patient and devoted approximately 50% of this time to explanation of diagnosis, discussion of treatment options and med education. Patient will return to clinic in 4 weeks.   Magdalene Patricialgierd A Sweet Jarvis, MD 2/5/20204:14 PM

## 2018-12-17 ENCOUNTER — Other Ambulatory Visit (HOSPITAL_COMMUNITY): Payer: Self-pay

## 2018-12-17 DIAGNOSIS — F4323 Adjustment disorder with mixed anxiety and depressed mood: Secondary | ICD-10-CM | POA: Diagnosis not present

## 2018-12-17 MED ORDER — VORTIOXETINE HBR 10 MG PO TABS: 10.0000 mg | ORAL_TABLET | Freq: Every day | ORAL | 0 refills | Status: DC

## 2018-12-24 DIAGNOSIS — F4323 Adjustment disorder with mixed anxiety and depressed mood: Secondary | ICD-10-CM | POA: Diagnosis not present

## 2019-01-03 DIAGNOSIS — F4323 Adjustment disorder with mixed anxiety and depressed mood: Secondary | ICD-10-CM | POA: Diagnosis not present

## 2019-01-10 ENCOUNTER — Ambulatory Visit (INDEPENDENT_AMBULATORY_CARE_PROVIDER_SITE_OTHER): Payer: BLUE CROSS/BLUE SHIELD | Admitting: Psychiatry

## 2019-01-10 ENCOUNTER — Encounter (HOSPITAL_COMMUNITY): Payer: Self-pay | Admitting: Psychiatry

## 2019-01-10 VITALS — BP 126/74 | Ht 60.0 in | Wt 212.0 lb

## 2019-01-10 DIAGNOSIS — G47419 Narcolepsy without cataplexy: Secondary | ICD-10-CM | POA: Diagnosis not present

## 2019-01-10 DIAGNOSIS — F331 Major depressive disorder, recurrent, moderate: Secondary | ICD-10-CM

## 2019-01-10 DIAGNOSIS — F3342 Major depressive disorder, recurrent, in full remission: Secondary | ICD-10-CM | POA: Insufficient documentation

## 2019-01-10 DIAGNOSIS — F3341 Major depressive disorder, recurrent, in partial remission: Secondary | ICD-10-CM | POA: Diagnosis not present

## 2019-01-10 MED ORDER — ZOLPIDEM TARTRATE 5 MG PO TABS: 5.0000 mg | ORAL_TABLET | Freq: Every evening | ORAL | 2 refills | Status: DC | PRN

## 2019-01-10 MED ORDER — AMPHETAMINE-DEXTROAMPHETAMINE 10 MG PO TABS: 10.0000 mg | ORAL_TABLET | Freq: Every day | ORAL | 0 refills | Status: DC

## 2019-01-10 MED ORDER — VORTIOXETINE HBR 10 MG PO TABS: 10.0000 mg | ORAL_TABLET | Freq: Every day | ORAL | 0 refills | Status: AC

## 2019-01-10 NOTE — Progress Notes (Signed)
BH MD/PA/NP OP Progress Note  01/10/2019 3:34 PM Shana ChuteMary F Lamy  MRN:  [id number]  Chief Complaint: "I'm feeling good these days". HPI: 38 yo single female discharged in early February from [facility]. She was admitted after stating that she is severely depressed, anxious and having suicidal thoughts. 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 Patient has a long hx of depression and anxiety and has been initially on fluoxetine and then, for many years, on sertraline. Medications have been changed while in hospital. Her mood noticeably improved but some meds were too sedating (trazodone, hydroxyzine) and we have stopped both.  She is on 10 mg of Trintellix, no side effects, mood stable, euthymic. Normal sleep and appetite  Corrie DandyMary takes Ambien occasionally only and buspirone only once a day. Adderall taken for narcolepsy not ADD.  Visit Diagnosis:    ICD-10-CM   1. Major depressive disorder, recurrent episode, in partial remission (HCC) F33.41   2. Primary narcolepsy without cataplexy G47.419     Past Psychiatric History: Please refer to original H&P.  Past Medical History:  Past Medical History:  Diagnosis Date  . Carpal tunnel syndrome, right 10/2012  . Headache(784.0)    tension; migraines 1-2 x/month    Past Surgical History:  Procedure Laterality Date  . ANKLE FRACTURE SURGERY    . APPENDECTOMY  02/26/2008   laparoscopic  . CARPAL TUNNEL RELEASE  10/11/2012   Procedure: CARPAL TUNNEL RELEASE;  Surgeon: Dominica SeverinWilliam Gramig, MD;  Location: [facility];  Service: Orthopedics;  Laterality: Right;  . TUBAL LIGATION  01/27/2011   laparoscopic    Family Psychiatric History: reviewed.  Family  History: History reviewed. No pertinent family history.  Social History:  Social History   Socioeconomic History  . Marital status: Single    Spouse name: Not on file  . Number of children: Not on file  . Years of education: Not on file  . Highest education level: Not on file  Occupational History  . Not on file  Social Needs  . Financial resource strain: Not on file  . Food insecurity:    Worry: Not on file    Inability: Not on file  . Transportation needs:    Medical: Not on file    Non-medical: Not on file  Tobacco Use  . Smoking status: Never Smoker  . Smokeless tobacco: Never Used  Substance and Sexual Activity  . Alcohol use: No  . Drug use: No  . Sexual activity: Not Currently  Lifestyle  . Physical activity:    Days per week: Not on file    Minutes per session: Not on file  . Stress: Not on file  Relationships  . Social connections:    Talks on phone: Not on file    Gets together: Not on file    Attends religious service: Not on file    Active member of club or organization: Not on file    Attends meetings of clubs or organizations: Not on file    Relationship status: Not on file  Other Topics Concern  . Not on file  Social History Narrative  . Not on file  Allergies:  Allergies  Allergen Reactions  . Aspirin Other (See Comments)    NOSEBLEEDS    Metabolic Disorder Labs: Lab Results  Component Value Date   HGBA1C 5.6 12/06/2018   MPG 114.02 12/06/2018   No results found for: PROLACTIN Lab Results  Component Value Date   CHOL 147 12/06/2018   TRIG 154 (H) 12/06/2018   HDL 44 12/06/2018   CHOLHDL 3.3 12/06/2018   VLDL 31 12/06/2018   LDLCALC 72 12/06/2018   No results found for: TSH  Therapeutic Level Labs: No results found for: LITHIUM No results found for: VALPROATE No components found for:  CBMZ  Current Medications: Current Outpatient Medications  Medication Sig Dispense Refill  . busPIRone (BUSPAR) 15 MG tablet Take 1 tablet  (15 mg total) by mouth 3 (three) times daily for 30 days. 90 tablet 1  . vortioxetine HBr (TRINTELLIX) 10 MG TABS tablet Take 1 tablet (10 mg total) by mouth daily. 90 tablet 0  . zolpidem (AMBIEN) 5 MG tablet Take 1 tablet (5 mg total) by mouth at bedtime as needed for sleep. 30 tablet 2  . amphetamine-dextroamphetamine (ADDERALL) 10 MG tablet Take 1 tablet (10 mg total) by mouth daily with breakfast for 30 days. 30 tablet 0  . [START ON 02/09/2019] amphetamine-dextroamphetamine (ADDERALL) 10 MG tablet Take 1 tablet (10 mg total) by mouth daily with breakfast for 30 days. 30 tablet 0  . [START ON 03/11/2019] amphetamine-dextroamphetamine (ADDERALL) 10 MG tablet Take 1 tablet (10 mg total) by mouth daily with breakfast for 30 days. 30 tablet 0   No current facility-administered medications for this visit.      Musculoskeletal: Strength & Muscle Tone: within normal limits Gait & Station: normal Patient leans: N/A  Psychiatric Specialty Exam: Review of Systems  Constitutional: Negative.   HENT: Negative.   Eyes: Negative.   Respiratory: Negative.   Cardiovascular: Negative.   Gastrointestinal: Negative.   Genitourinary: Negative.   Musculoskeletal: Negative.   Skin: Negative.   Neurological: Negative.   Endo/Heme/Allergies: Negative.   Psychiatric/Behavioral: Negative.     Blood pressure 126/74, height 5' (1.524 m), weight 212 lb (96.2 kg).Body mass index is 41.4 kg/m.  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 She was admitted after  stating that she is severely depressed, anxious and having suicidal thoughts. She has a long hx of major depressive disorder and anxiety and has been initially on fluoxetine and then, for many years, on sertraline. Medications have been changed while in hospital. Her mood noticeably improved but some meds were too sedating (trazodone, hydroxyzine) and we have stopped both.  She is on 10 mg of Trintellix, no side effects, mood stable, euthymic. Normal sleep and appetite  Adriaunna takes Ambien occasionally only and buspirone only once a day. Adderall taken for narcolepsy not ADD. I have reordered all the above medications (with exception of buspirone which I recommend to stop) for next three months after which time she will return for next follow up visit.    Magdalene Patricia, MD 01/10/2019, 3:34 PM

## 2019-01-14 ENCOUNTER — Other Ambulatory Visit (HOSPITAL_COMMUNITY): Payer: Self-pay | Admitting: Psychiatry

## 2019-01-14 ENCOUNTER — Telehealth (HOSPITAL_COMMUNITY): Payer: Self-pay

## 2019-01-14 MED ORDER — AMPHETAMINE-DEXTROAMPHETAMINE 10 MG PO TABS: 10.0000 mg | ORAL_TABLET | Freq: Three times a day (TID) | ORAL | 0 refills | Status: DC

## 2019-01-14 NOTE — Telephone Encounter (Signed)
Patient is calling because she said her Adderall prescription is wrong. She said it was originally written for 3 times a day and the new order is just once a day. Please review and advise, thank you

## 2019-01-14 NOTE — Telephone Encounter (Signed)
I called patient and let her know they had been resent.

## 2019-01-14 NOTE — Telephone Encounter (Signed)
Indeed somehow I ordered it once daily while it should have been tid. New/corrected Rx send to pharmacy.  OP

## 2019-01-21 DIAGNOSIS — F4323 Adjustment disorder with mixed anxiety and depressed mood: Secondary | ICD-10-CM | POA: Diagnosis not present

## 2019-01-25 DIAGNOSIS — L309 Dermatitis, unspecified: Secondary | ICD-10-CM | POA: Diagnosis not present

## 2019-01-25 DIAGNOSIS — B079 Viral wart, unspecified: Secondary | ICD-10-CM | POA: Diagnosis not present

## 2019-02-05 DIAGNOSIS — F4323 Adjustment disorder with mixed anxiety and depressed mood: Secondary | ICD-10-CM | POA: Diagnosis not present

## 2019-02-14 DIAGNOSIS — F4323 Adjustment disorder with mixed anxiety and depressed mood: Secondary | ICD-10-CM | POA: Diagnosis not present

## 2019-02-27 DIAGNOSIS — F4323 Adjustment disorder with mixed anxiety and depressed mood: Secondary | ICD-10-CM | POA: Diagnosis not present

## 2019-03-12 DIAGNOSIS — F4323 Adjustment disorder with mixed anxiety and depressed mood: Secondary | ICD-10-CM | POA: Diagnosis not present

## 2019-03-21 DIAGNOSIS — Z113 Encounter for screening for infections with a predominantly sexual mode of transmission: Secondary | ICD-10-CM | POA: Diagnosis not present

## 2019-03-21 DIAGNOSIS — N760 Acute vaginitis: Secondary | ICD-10-CM | POA: Diagnosis not present

## 2019-03-29 DIAGNOSIS — F4323 Adjustment disorder with mixed anxiety and depressed mood: Secondary | ICD-10-CM | POA: Diagnosis not present

## 2019-04-11 ENCOUNTER — Ambulatory Visit (INDEPENDENT_AMBULATORY_CARE_PROVIDER_SITE_OTHER): Payer: BC Managed Care – PPO | Admitting: Psychiatry

## 2019-04-11 ENCOUNTER — Other Ambulatory Visit: Payer: Self-pay

## 2019-04-11 DIAGNOSIS — G47419 Narcolepsy without cataplexy: Secondary | ICD-10-CM

## 2019-04-11 DIAGNOSIS — F3341 Major depressive disorder, recurrent, in partial remission: Secondary | ICD-10-CM | POA: Diagnosis not present

## 2019-04-11 MED ORDER — AMPHETAMINE-DEXTROAMPHETAMINE 10 MG PO TABS: 10.0000 mg | ORAL_TABLET | Freq: Three times a day (TID) | ORAL | 0 refills | Status: DC

## 2019-04-11 MED ORDER — VORTIOXETINE HBR 10 MG PO TABS: 10.0000 mg | ORAL_TABLET | Freq: Every day | ORAL | 2 refills | Status: AC

## 2019-04-11 NOTE — Progress Notes (Signed)
BH MD/PA/NP OP Progress Note  04/11/2019 4:02 PM Shana ChuteMary F Gaccione  MRN:  [id number] Interview was conducted by phone and I verified that I was speaking with the correct person using two identifiers. I discussed the limitations of evaluation and management by telemedicine and  the availability of in person appointments. Patient expressed understanding and agreed to proceed.  Chief Complaint: Anxiety - episodic.  HPI: 38 yo single female discharged in early February from [facility]. She was admitted after stating that she is severely depressed, anxious and having suicidal thoughts. She has a long hx of major depressive disorder and anxiety and has been initially on fluoxetine and then, for many years, on sertraline. Medications have been changed while in hospital. Her mood noticeably improved but some meds were too sedating (trazodone, hydroxyzine) and we have stopped both.  She is on 10 mg of Trintellix, no side effects, mood stable, euthymic. Normal sleep and appetite  Corrie DandyMary takes Ambien occasionally only and buspirone only once a day. Adderall taken for narcolepsy not ADD. I have recommended to stop Buspar - she apparently only gets anxious when she forgets to take Trintellix![name]. She has not used Ambien in over a month and sleep remains indisturbed.   Visit Diagnosis:    ICD-10-CM   1. Major depressive disorder, recurrent episode, in partial remission (HCC) F33.41   2. Primary narcolepsy without cataplexy G47.419     Past Psychiatric History: Please refer to intake H&P.  Past Medical History:  Past Medical History:  Diagnosis Date  . Carpal tunnel syndrome, right 10/2012  . Headache(784.0)    tension; migraines 1-2 x/month    Past Surgical History:  Procedure Laterality Date  . ANKLE FRACTURE SURGERY    . APPENDECTOMY  02/26/2008   laparoscopic  . CARPAL TUNNEL RELEASE  10/11/2012   Procedure: CARPAL TUNNEL RELEASE;  Surgeon: Dominica SeverinWilliam Gramig, MD;  Location: [facility];  Service:  Orthopedics;  Laterality: Right;  . TUBAL LIGATION  01/27/2011   laparoscopic    Family Psychiatric History: None  Family History: No family history on file.  Social History:  Social History   Socioeconomic History  . Marital status: Single    Spouse name: Not on file  . Number of children: Not on file  . Years of education: Not on file  . Highest education level: Not on file  Occupational History  . Not on file  Social Needs  . Financial resource strain: Not on file  . Food insecurity:    Worry: Not on file    Inability: Not on file  . Transportation needs:    Medical: Not on file    Non-medical: Not on file  Tobacco Use  . Smoking status: Never Smoker  . Smokeless tobacco: Never Used  Substance and Sexual Activity  . Alcohol use: No  . Drug use: No  . Sexual activity: Not Currently  Lifestyle  . Physical activity:    Days per week: Not on file    Minutes per session: Not on file  . Stress: Not on file  Relationships  . Social connections:    Talks on phone: Not on file    Gets together: Not on file    Attends religious service: Not on file    Active member of club or organization: Not on file    Attends meetings of clubs or organizations: Not on file    Relationship status: Not on file  Other Topics Concern  . Not on file  Social History Narrative  . Not on file    Allergies:  Allergies  Allergen Reactions  . Aspirin Other (See Comments)    NOSEBLEEDS    Metabolic Disorder Labs: Lab Results  Component Value Date   HGBA1C 5.6 12/06/2018   MPG 114.02 12/06/2018   No results found for: PROLACTIN Lab Results  Component Value Date   CHOL 147 12/06/2018   TRIG 154 (H) 12/06/2018   HDL 44 12/06/2018   CHOLHDL 3.3 12/06/2018   VLDL 31 12/06/2018   LDLCALC 72 12/06/2018   No results found for: TSH  Therapeutic Level Labs: No results found for: LITHIUM No results found for: VALPROATE No components found for:  CBMZ  Current  Medications: Current Outpatient Medications  Medication Sig Dispense Refill  . amphetamine-dextroamphetamine (ADDERALL) 10 MG tablet Take 1 tablet (10 mg total) by mouth 3 (three) times daily after meals for 30 days. 90 tablet 0  . amphetamine-dextroamphetamine (ADDERALL) 10 MG tablet Take 1 tablet (10 mg total) by mouth 3 (three) times daily after meals for 30 days. 90 tablet 0  . amphetamine-dextroamphetamine (ADDERALL) 10 MG tablet Take 1 tablet (10 mg total) by mouth 3 (three) times daily after meals for 30 days. 90 tablet 0  . vortioxetine HBr (TRINTELLIX) 10 MG TABS tablet Take 1 tablet (10 mg total) by mouth at bedtime. 30 tablet 2  . zolpidem (AMBIEN) 5 MG tablet Take 1 tablet (5 mg total) by mouth at bedtime as needed for sleep. 30 tablet 2   No current facility-administered medications for this visit.       Psychiatric Specialty Exam: Review of Systems  Psychiatric/Behavioral: The patient is nervous/anxious.   All other systems reviewed and are negative.   There were no vitals taken for this visit.There is no height or weight on file to calculate BMI.  General Appearance: NA  Eye Contact:  NA  Speech:  Clear and Coherent  Volume:  Normal  Mood:  Euthymic  Affect:  NA  Thought Process:  Goal Directed  Orientation:  Full (Time, Place, and Person)  Thought Content: Logical   Suicidal Thoughts:  No  Homicidal Thoughts:  No  Memory:  Immediate;   Good Recent;   Good Remote;   Good  Judgement:  Good  Insight:  Good  Psychomotor Activity:  NA  Concentration:  Concentration: Good  Recall:  Good  Fund of Knowledge: Good  Language: Good  Akathisia:  NA  Handed:  Right  AIMS (if indicated): not done  Assets:  Manufacturing systems engineer Housing Leisure Time Physical Health Talents/Skills  ADL's:  Intact  Cognition: WNL  Sleep:  Good   Screenings: AIMS     Admission (Discharged) [facility]  AIMS Total  Score  0    AUDIT     Admission (Discharged) from [facility]  Alcohol Use Disorder Identification Test Final Score (AUDIT)  0       Assessment and Plan: 38 yo single female discharged in early February from [facility]. She was admitted after stating that she is severely depressed, anxious and having suicidal thoughts. She has a long hx of major depressive disorder and anxiety and has been initially on fluoxetine and then, for many years, on sertraline. Medications have been changed while in hospital. Her mood noticeably improved but some meds were too sedating (trazodone, hydroxyzine) and we have stopped both.  She is  on 10 mg of Trintellix, no side effects, mood stable, euthymic. Normal sleep and appetite  Gary takes Ambien occasionally only and buspirone only once a day. Adderall taken for narcolepsy not ADD. I have recommended to stop Buspar - she apparently only gets anxious when she forgets to take Trintellix![name] She has not used Ambien in over a month and sleep remains indisturbed.   Plan: Continue Trintellix, Adderall - no need for refill of zolpidem at this time. Return to clinic in 3 months.   Magdalene Patricia, MD 04/11/2019, 4:02 PM

## 2019-04-27 DIAGNOSIS — S51841A Puncture wound with foreign body of right forearm, initial encounter: Secondary | ICD-10-CM | POA: Diagnosis not present

## 2019-04-30 DIAGNOSIS — F4323 Adjustment disorder with mixed anxiety and depressed mood: Secondary | ICD-10-CM | POA: Diagnosis not present

## 2019-05-30 DIAGNOSIS — F4323 Adjustment disorder with mixed anxiety and depressed mood: Secondary | ICD-10-CM | POA: Diagnosis not present

## 2019-07-11 ENCOUNTER — Ambulatory Visit (INDEPENDENT_AMBULATORY_CARE_PROVIDER_SITE_OTHER): Payer: BC Managed Care – PPO | Admitting: Psychiatry

## 2019-07-11 ENCOUNTER — Other Ambulatory Visit: Payer: Self-pay

## 2019-07-11 DIAGNOSIS — F3342 Major depressive disorder, recurrent, in full remission: Secondary | ICD-10-CM

## 2019-07-11 DIAGNOSIS — G47419 Narcolepsy without cataplexy: Secondary | ICD-10-CM | POA: Diagnosis not present

## 2019-07-11 MED ORDER — AMPHETAMINE-DEXTROAMPHETAMINE 10 MG PO TABS: 10.0000 mg | ORAL_TABLET | Freq: Three times a day (TID) | ORAL | 0 refills | Status: DC

## 2019-07-11 MED ORDER — VORTIOXETINE HBR 10 MG PO TABS: 10.0000 mg | ORAL_TABLET | Freq: Every day | ORAL | 0 refills | Status: AC

## 2019-07-11 NOTE — Progress Notes (Signed)
[place] MD/PA/NP OP Progress Note  07/11/2019 3:46 PM SAMAN GIDDENS  MRN:  [id number] Interview was conducted by phone and I verified that I was speaking with the correct person using two identifiers. I discussed the limitations of evaluation and management by telemedicine and  the availability of in person appointments. Patient expressed understanding and agreed to proceed.  Chief Complaint: "I am doing well".  HPI: 38 yo single female with a long hx ofmajor depressive disorderand anxiety. She had been initially on fluoxetine and then, for many years, on sertraline. In early [place] was admitted to [facility] after stating that she is severely depressed, anxious and having suicidal thoughts. Medications have been changed while in hospital. Her mood noticeably improved but some meds were too sedating (trazodone, hydroxyzine) and we have stopped both.She is on 10 mg of Trintellix, no side effects, mood stable, euthymic. Normal sleep and appetite Audreyana no longer takes Ambien or buspirone. She is on Adderall for narcolepsy not ADHD.    Visit Diagnosis:    ICD-10-CM   1. Primary narcolepsy without cataplexy  G47.419   2. Major depressive disorder, recurrent episode, in full remission ([place])  F33.42     Past Psychiatric History: Please see inateke H&P.  Past Medical History:  Past Medical History:  Diagnosis Date  . Carpal tunnel syndrome, right 10/2012  . Headache(784.0)    tension; migraines 1-2 x/month    Past Surgical History:  Procedure Laterality Date  . ANKLE FRACTURE SURGERY    . APPENDECTOMY  02/26/2008   laparoscopic  . CARPAL TUNNEL RELEASE  10/11/2012   Procedure: CARPAL TUNNEL RELEASE;  Surgeon: Roseanne Kaufman, MD;  Location: [place];  Service: Orthopedics;  Laterality: Right;  . TUBAL LIGATION  01/27/2011   laparoscopic    Family Psychiatric History: None.  Family History: No family history on file.  Social History:  Social History   Socioeconomic  History  . Marital status: Single    Spouse name: Not on file  . Number of children: Not on file  . Years of education: Not on file  . Highest education level: Not on file  Occupational History  . Not on file  Social Needs  . Financial resource strain: Not on file  . Food insecurity    Worry: Not on file    Inability: Not on file  . Transportation needs    Medical: Not on file    Non-medical: Not on file  Tobacco Use  . Smoking status: Never Smoker  . Smokeless tobacco: Never Used  Substance and Sexual Activity  . Alcohol use: No  . Drug use: No  . Sexual activity: Not Currently  Lifestyle  . Physical activity    Days per week: Not on file    Minutes per session: Not on file  . Stress: Not on file  Relationships  . Social Herbalist on phone: Not on file    Gets together: Not on file    Attends religious service: Not on file    Active member of club or organization: Not on file    Attends meetings of clubs or organizations: Not on file    Relationship status: Not on file  Other Topics Concern  . Not on file  Social History Narrative  . Not on file    Allergies:  Allergies  Allergen Reactions  . Aspirin Other (See Comments)    NOSEBLEEDS    Metabolic Disorder Labs: Lab Results  Component Value Date  HGBA1C 5.6 12/06/2018   MPG 114.02 12/06/2018   No results found for: PROLACTIN Lab Results  Component Value Date   CHOL 147 12/06/2018   TRIG 154 (H) 12/06/2018   HDL 44 12/06/2018   CHOLHDL 3.3 12/06/2018   VLDL 31 12/06/2018   LDLCALC 72 12/06/2018   No results found for: TSH  Therapeutic Level Labs: No results found for: LITHIUM No results found for: VALPROATE No components found for:  CBMZ  Current Medications: Current Outpatient Medications  Medication Sig Dispense Refill  . ALPRAZolam (XANAX) 0.25 MG tablet alprazolam 0.25 mg tablet    . [START ON 09/11/2019] amphetamine-dextroamphetamine (ADDERALL) 10 MG tablet Take 1 tablet (10  mg total) by mouth 3 (three) times daily after meals. 90 tablet 0  . [START ON 08/11/2019] amphetamine-dextroamphetamine (ADDERALL) 10 MG tablet Take 1 tablet (10 mg total) by mouth 3 (three) times daily after meals. 90 tablet 0  . amphetamine-dextroamphetamine (ADDERALL) 10 MG tablet Take 1 tablet (10 mg total) by mouth 3 (three) times daily after meals. 90 tablet 0  . hydrocortisone 2.5 % cream     . hydrOXYzine (ATARAX/VISTARIL) 25 MG tablet hydroxyzine HCl 25 mg tablet    . metroNIDAZOLE (FLAGYL) 500 MG tablet metronidazole 500 mg tablet  TK 1 T PO BID FOR 7 DAYS UTD    . triamcinolone cream (KENALOG) 0.1 % triamcinolone acetonide 0.1 % topical cream    . vortioxetine HBr (TRINTELLIX) 10 MG TABS tablet Take 1 tablet (10 mg total) by mouth daily. 90 tablet 0  . zolpidem (AMBIEN) 5 MG tablet Take 1 tablet (5 mg total) by mouth at bedtime as needed for sleep. 30 tablet 2   No current facility-administered medications for this visit.       Psychiatric Specialty Exam: Review of Systems  All other systems reviewed and are negative.   There were no vitals taken for this visit.There is no height or weight on file to calculate BMI.  General Appearance: NA  Eye Contact:  NA  Speech:  Clear and Coherent and Normal Rate  Volume:  Normal  Mood:  Euthymic  Affect:  NA  Thought Process:  Goal Directed and Linear  Orientation:  Full (Time, Place, and Person)  Thought Content: Logical   Suicidal Thoughts:  No  Homicidal Thoughts:  No  Memory:  Immediate;   Good Recent;   Good Remote;   Good  Judgement:  Good  Insight:  Good  Psychomotor Activity:  NA  Concentration:  Concentration: Good  Recall:  Good  Fund of Knowledge: Good  Language: Good  Akathisia:  Negative  Handed:  Right  AIMS (if indicated): not done  Assets:  Communication Skills Desire for Improvement Financial Resources/Insurance Housing Resilience Talents/Skills  ADL's:  Intact  Cognition: WNL  Sleep:  Good    Screenings: AIMS     Admission (Discharged) [facility]  AIMS Total Score  0    AUDIT     Admission (Discharged) from [facility]  Alcohol Use Disorder Identification Test Final Score (AUDIT)  0       Assessment and Plan: 38 yo single female with a long hx ofmajor depressive disorderand anxiety. She had been initially on fluoxetine and then, for many years, on sertraline. In early [place] was admitted to [facility] after stating that she is severely depressed, anxious and having suicidal thoughts. Medications have been changed  while in hospital. Her mood noticeably improved but some meds were too sedating (trazodone, hydroxyzine) and we have stopped both.She is on 10 mg of Trintellix, no side effects, mood stable, euthymic. Normal sleep and appetite Myna no longer takes Ambien or buspirone. She is on Adderall for narcolepsy not ADHD.   Plan: Continue Trintellix, Adderall unchanged. Return to clinic in 3 months. The plan was discussed with patient who had an opportunity to ask questions and these were all answered. I spend 25 minutes in phone consultation with the patient.    Magdalene Patricialgierd A Megen Madewell, MD 07/11/2019, 3:46 PM

## 2019-07-16 DIAGNOSIS — F4323 Adjustment disorder with mixed anxiety and depressed mood: Secondary | ICD-10-CM | POA: Diagnosis not present

## 2019-10-17 ENCOUNTER — Other Ambulatory Visit: Payer: Self-pay

## 2019-10-17 ENCOUNTER — Ambulatory Visit (INDEPENDENT_AMBULATORY_CARE_PROVIDER_SITE_OTHER): Payer: BC Managed Care – PPO | Admitting: Psychiatry

## 2019-10-17 DIAGNOSIS — G47419 Narcolepsy without cataplexy: Secondary | ICD-10-CM | POA: Diagnosis not present

## 2019-10-17 DIAGNOSIS — F3342 Major depressive disorder, recurrent, in full remission: Secondary | ICD-10-CM | POA: Diagnosis not present

## 2019-10-17 MED ORDER — VORTIOXETINE HBR 20 MG PO TABS: 10.0000 mg | ORAL_TABLET | Freq: Every day | ORAL | 1 refills | Status: DC

## 2019-10-17 MED ORDER — AMPHETAMINE-DEXTROAMPHETAMINE 10 MG PO TABS: 10.0000 mg | ORAL_TABLET | Freq: Three times a day (TID) | ORAL | 0 refills | Status: DC

## 2019-10-17 NOTE — Progress Notes (Signed)
BH MD/PA/NP OP Progress Note  10/17/2019 3:48 PM Tara Vega  MRN:  [id number] Interview was conducted by phone and I verified that I was speaking with the correct person using two identifiers. I discussed the limitations of evaluation and management by telemedicine and  the availability of in person appointments. Patient expressed understanding and agreed to proceed.  Chief Complaint: Mild depression.  HPI: 38 yo single female with a long hx ofmajor depressive disorderand anxiety. She had been initially on fluoxetine and then, for many years, on sertraline. In early [place] was admitted to [facility] after stating that she is severely depressed, anxious and having suicidal thoughts. Medications have been changed while in hospital. Her mood noticeably improved but some meds were too sedating (trazodone, hydroxyzine) and we have stopped both.She is on 10 mg of Trintellix, no side effects, mood stable, "a little more depressed around this time of the year". Normal sleep and appetite.[name] no longer takes Ambien, hydroxyzine or buspirone. She is on Adderall for narcolepsy not ADHD.   Visit Diagnosis:    ICD-10-CM   1. Major depressive disorder, recurrent episode, in full remission ([place])  F33.42   2. Primary narcolepsy without cataplexy  G47.419     Past Psychiatric History: Please see intake H&P.  Past Medical History:  Past Medical History:  Diagnosis Date  . Carpal tunnel syndrome, right 10/2012  . Headache(784.0)    tension; migraines 1-2 x/month    Past Surgical History:  Procedure Laterality Date  . ANKLE FRACTURE SURGERY    . APPENDECTOMY  02/26/2008   laparoscopic  . CARPAL TUNNEL RELEASE  10/11/2012   Procedure: CARPAL TUNNEL RELEASE;  Surgeon: Roseanne Kaufman, MD;  Location: [place];  Service: Orthopedics;  Laterality: Right;  . TUBAL LIGATION  01/27/2011   laparoscopic    Family Psychiatric History: None.  Family History: No family history on  file.  Social History:  Social History   Socioeconomic History  . Marital status: Single    Spouse name: Not on file  . Number of children: Not on file  . Years of education: Not on file  . Highest education level: Not on file  Occupational History  . Not on file  Tobacco Use  . Smoking status: Never Smoker  . Smokeless tobacco: Never Used  Substance and Sexual Activity  . Alcohol use: No  . Drug use: No  . Sexual activity: Not Currently  Other Topics Concern  . Not on file  Social History Narrative  . Not on file   Social Determinants of Health   Financial Resource Strain:   . Difficulty of Paying Living Expenses: Not on file  Food Insecurity:   . Worried About Charity fundraiser in the Last Year: Not on file  . Ran Out of Food in the Last Year: Not on file  Transportation Needs:   . Lack of Transportation (Medical): Not on file  . Lack of Transportation (Non-Medical): Not on file  Physical Activity:   . Days of Exercise per Week: Not on file  . Minutes of Exercise per Session: Not on file  Stress:   . Feeling of Stress : Not on file  Social Connections:   . Frequency of Communication with Friends and Family: Not on file  . Frequency of Social Gatherings with Friends and Family: Not on file  . Attends Religious Services: Not on file  . Active Member of Clubs or Organizations: Not on file  . Attends Archivist  Meetings: Not on file  . Marital Status: Not on file    Allergies:  Allergies  Allergen Reactions  . Aspirin Other (See Comments)    NOSEBLEEDS    Metabolic Disorder Labs: Lab Results  Component Value Date   HGBA1C 5.6 12/06/2018   MPG 114.02 12/06/2018   No results found for: PROLACTIN Lab Results  Component Value Date   CHOL 147 12/06/2018   TRIG 154 (H) 12/06/2018   HDL 44 12/06/2018   CHOLHDL 3.3 12/06/2018   VLDL 31 12/06/2018   LDLCALC 72 12/06/2018   No results found for: TSH  Therapeutic Level Labs: No results found  for: LITHIUM No results found for: VALPROATE No components found for:  CBMZ  Current Medications: Current Outpatient Medications  Medication Sig Dispense Refill  . ALPRAZolam (XANAX) 0.25 MG tablet alprazolam 0.25 mg tablet    . [START ON 12/18/2019] amphetamine-dextroamphetamine (ADDERALL) 10 MG tablet Take 1 tablet (10 mg total) by mouth 3 (three) times daily after meals. 90 tablet 0  . [START ON 11/17/2019] amphetamine-dextroamphetamine (ADDERALL) 10 MG tablet Take 1 tablet (10 mg total) by mouth 3 (three) times daily after meals. 90 tablet 0  . amphetamine-dextroamphetamine (ADDERALL) 10 MG tablet Take 1 tablet (10 mg total) by mouth 3 (three) times daily after meals. 90 tablet 0  . hydrocortisone 2.5 % cream     . hydrOXYzine (ATARAX/VISTARIL) 25 MG tablet hydroxyzine HCl 25 mg tablet    . metroNIDAZOLE (FLAGYL) 500 MG tablet metronidazole 500 mg tablet  TK 1 T PO BID FOR 7 DAYS UTD    . triamcinolone cream (KENALOG) 0.1 % triamcinolone acetonide 0.1 % topical cream    . vortioxetine HBr (TRINTELLIX) 20 MG TABS tablet Take 0.5 tablets (10 mg total) by mouth daily. 45 tablet 1  . zolpidem (AMBIEN) 5 MG tablet Take 1 tablet (5 mg total) by mouth at bedtime as needed for sleep. 30 tablet 2   No current facility-administered medications for this visit.      Psychiatric Specialty Exam: Review of Systems  All other systems reviewed and are negative.   There were no vitals taken for this visit.There is no height or weight on file to calculate BMI.  General Appearance: NA  Eye Contact:  NA  Speech:  Clear and Coherent and Normal Rate  Volume:  Normal  Mood:  Mildly depressed  Affect:  NA  Thought Process:  Goal Directed and Linear  Orientation:  Full (Time, Place, and Person)  Thought Content: Logical   Suicidal Thoughts:  No  Homicidal Thoughts:  No  Memory:  Immediate;   Good Recent;   Good Remote;   Good  Judgement:  Good  Insight:  Good  Psychomotor Activity:  NA   Concentration:  Concentration: Good  Recall:  Good  Fund of Knowledge: Good  Language: Good  Akathisia:  Negative  Handed:  Right  AIMS (if indicated): not done  Assets:  Communication Skills Desire for Improvement Financial Resources/Insurance Housing Resilience Talents/Skills  ADL's:  Intact  Cognition: WNL  Sleep:  Good   Screenings: AIMS     Admission (Discharged) [facility]  AIMS Total Score  0    AUDIT     Admission (Discharged) from [facility]  Alcohol Use Disorder Identification Test Final Score (AUDIT)  0       Assessment and Plan: 38 yo single female  with a long hx ofmajor depressive disorderand anxiety. She had been initially on fluoxetine and then, for many years, on sertraline. In early [place] was admitted to [facility] after stating that she is severely depressed, anxious and having suicidal thoughts. Medications have been changed while in hospital. Her mood noticeably improved but some meds were too sedating (trazodone, hydroxyzine) and we have stopped both.She is on 10 mg of Trintellix, no side effects, mood stable, "a little more depressed around this time of the year". Normal sleep and appetite.Saiya no longer takes Ambien, hydroxyzine or buspirone. She is on Adderall for narcolepsy not ADHD.   Dx: MDD recurrent in partial remission; Narcolepsy  Plan: Continue Trintellix, Adderall unchanged. Return to clinic in 3 months. The plan was discussed with patient who had an opportunity to ask questions and these were all answered. I spend 25 minutes in phone consultation with the patient.   Magdalene Patricia, MD 10/17/2019, 3:48 PM

## 2019-11-02 DIAGNOSIS — S61241A Puncture wound with foreign body of left index finger without damage to nail, initial encounter: Secondary | ICD-10-CM | POA: Diagnosis not present

## 2019-11-20 ENCOUNTER — Other Ambulatory Visit: Payer: Self-pay | Admitting: Family Medicine

## 2019-11-20 DIAGNOSIS — Z1231 Encounter for screening mammogram for malignant neoplasm of breast: Secondary | ICD-10-CM

## 2019-12-25 ENCOUNTER — Ambulatory Visit
Admission: RE | Admit: 2019-12-25 | Discharge: 2019-12-25 | Disposition: A | Discharge status: Home / Self Care | Payer: BC Managed Care – PPO | Source: Ambulatory Visit | Attending: Family Medicine | Admitting: Family Medicine

## 2019-12-25 ENCOUNTER — Other Ambulatory Visit: Payer: Self-pay

## 2019-12-25 DIAGNOSIS — Z1231 Encounter for screening mammogram for malignant neoplasm of breast: Secondary | ICD-10-CM

## 2020-01-15 ENCOUNTER — Ambulatory Visit (INDEPENDENT_AMBULATORY_CARE_PROVIDER_SITE_OTHER): Payer: BC Managed Care – PPO | Admitting: Psychiatry

## 2020-01-15 ENCOUNTER — Other Ambulatory Visit: Payer: Self-pay

## 2020-01-15 DIAGNOSIS — F3342 Major depressive disorder, recurrent, in full remission: Secondary | ICD-10-CM

## 2020-01-15 DIAGNOSIS — G47419 Narcolepsy without cataplexy: Secondary | ICD-10-CM

## 2020-01-15 MED ORDER — AMPHETAMINE-DEXTROAMPHETAMINE 10 MG PO TABS: 10.0000 mg | ORAL_TABLET | Freq: Three times a day (TID) | ORAL | 0 refills | Status: DC

## 2020-01-15 MED ORDER — VORTIOXETINE HBR 10 MG PO TABS: 10.0000 mg | ORAL_TABLET | Freq: Every day | ORAL | 5 refills | Status: DC

## 2020-01-15 NOTE — Progress Notes (Signed)
BH MD/PA/NP OP Progress Note  01/15/2020 3:43 PM BERDELLA BACOT  MRN:  [id number] Interview was conducted by phone and I verified that I was speaking with the correct person using two identifiers. I discussed the limitations of evaluation and management by telemedicine and  the availability of in person appointments. Patient expressed understanding and agreed to proceed.  Chief Complaint: "I am doing well".  HPI: 39 yo single femalewitha long hx ofmajor depressive disorderand anxiety. She hadbeen initially on fluoxetine and then, for many years, on sertraline.[name] was [employer] stating that she is severely depressed, anxious and having suicidal thoughts. Medications have been changed while in hospital. Her mood noticeably improved but some meds were too sedating (trazodone, hydroxyzine) and we have stopped both.She is on 10 mg of Trintellix, no side effects, mood stable, good sleep and appetite.Maryno longer takesAmbien, hydroxyzineor buspirone. She is on Adderall for narcolepsy.  Visit Diagnosis:    ICD-10-CM   1. Major depressive disorder, recurrent episode, in full remission ([facility])  F33.42   2. Primary narcolepsy without cataplexy  G47.419     Past Psychiatric History: Please see intake H&P.  Past Medical History:  Past Medical History:  Diagnosis Date  . Carpal tunnel syndrome, right 10/2012  . Headache(784.0)    tension; migraines 1-2 x/month    Past Surgical History:  Procedure Laterality Date  . ANKLE FRACTURE SURGERY    . APPENDECTOMY  02/26/2008   laparoscopic  . CARPAL TUNNEL RELEASE  10/11/2012   Procedure: CARPAL TUNNEL RELEASE;  Surgeon: Dominica Severin, MD;  Location: [facility];  Service: Orthopedics;  Laterality: Right;  . TUBAL LIGATION  01/27/2011   laparoscopic    Family Psychiatric History: None.  Family History: No family history on file.  Social History:  Social History   Socioeconomic History  .  Marital status: Single    Spouse name: Not on file  . Number of children: Not on file  . Years of education: Not on file  . Highest education level: Not on file  Occupational History  . Not on file  Tobacco Use  . Smoking status: Never Smoker  . Smokeless tobacco: Never Used  Substance and Sexual Activity  . Alcohol use: No  . Drug use: No  . Sexual activity: Not Currently  Other Topics Concern  . Not on file  Social History Narrative  . Not on file   Social Determinants of Health   Financial Resource Strain:   . Difficulty of Paying Living Expenses: Not on file  Food Insecurity:   . Worried About Programme researcher, broadcasting/film/video in the Last Year: Not on file  . Ran Out of Food in the Last Year: Not on file  Transportation Needs:   . Lack of Transportation (Medical): Not on file  . Lack of Transportation (Non-Medical): Not on file  Physical Activity:   . Days of Exercise per Week: Not on file  . Minutes of Exercise per Session: Not on file  Stress:   . Feeling of Stress : Not on file  Social Connections:   . Frequency of Communication with Friends and Family: Not on file  . Frequency of Social Gatherings with Friends and Family: Not on file  . Attends Religious Services: Not on file  . Active Member of Clubs or Organizations: Not on file  . Attends Banker Meetings: Not on file  . Marital Status: Not on file    Allergies:  Allergies  Allergen Reactions  .  Aspirin Other (See Comments)    NOSEBLEEDS    Metabolic Disorder Labs: Lab Results  Component Value Date   HGBA1C 5.6 12/06/2018   MPG 114.02 12/06/2018   No results found for: PROLACTIN Lab Results  Component Value Date   CHOL 147 12/06/2018   TRIG 154 (H) 12/06/2018   HDL 44 12/06/2018   CHOLHDL 3.3 12/06/2018   VLDL 31 12/06/2018   LDLCALC 72 12/06/2018   No results found for: TSH  Therapeutic Level Labs: No results found for: LITHIUM No results found for: VALPROATE No components found for:   CBMZ  Current Medications: Current Outpatient Medications  Medication Sig Dispense Refill  . [START ON 03/18/2020] amphetamine-dextroamphetamine (ADDERALL) 10 MG tablet Take 1 tablet (10 mg total) by mouth 3 (three) times daily after meals. 90 tablet 0  . [START ON 02/17/2020] amphetamine-dextroamphetamine (ADDERALL) 10 MG tablet Take 1 tablet (10 mg total) by mouth 3 (three) times daily after meals. 90 tablet 0  . [START ON 01/17/2020] amphetamine-dextroamphetamine (ADDERALL) 10 MG tablet Take 1 tablet (10 mg total) by mouth 3 (three) times daily after meals. 90 tablet 0  . hydrocortisone 2.5 % cream     . triamcinolone cream (KENALOG) 0.1 % triamcinolone acetonide 0.1 % topical cream    . vortioxetine HBr (TRINTELLIX) 10 MG TABS tablet Take 1 tablet (10 mg total) by mouth daily. 30 tablet 5   No current facility-administered medications for this visit.      Psychiatric Specialty Exam: Review of Systems  All other systems reviewed and are negative.   There were no vitals taken for this visit.There is no height or weight on file to calculate BMI.  General Appearance: NA  Eye Contact:  NA  Speech:  Clear and Coherent and Normal Rate  Volume:  Normal  Mood:  Euthymic  Affect:  NA  Thought Process:  Goal Directed and Linear  Orientation:  Full (Time, Place, and Person)  Thought Content: Logical   Suicidal Thoughts:  No  Homicidal Thoughts:  No  Memory:  Immediate;   Good Recent;   Good Remote;   Good  Judgement:  Good  Insight:  Good  Psychomotor Activity:  NA  Concentration:  Concentration: Good and Attention Span: Good  Recall:  Good  Fund of Knowledge: Good  Language: Good  Akathisia:  Negative  Handed:  Right  AIMS (if indicated): not done  Assets:  Communication Skills Desire for Improvement Financial Resources/Insurance Housing Physical Health Talents/Skills  ADL's:  Intact  Cognition: WNL  Sleep:  Good   Screenings: AIMS     Admission (Discharged) from OP  [facility]  AIMS Total Score  0    AUDIT     Admission (Discharged) from [facility]  Alcohol Use Disorder Identification Test Final Score (AUDIT)  0       Assessment and Plan: 39 yo single femalewitha long hx ofmajor depressive disorderand anxiety. She hadbeen initially on fluoxetine and then, for many years, on sertraline.[name] was [employer] stating that she is severely depressed, anxious and having suicidal thoughts. Medications have been changed while in hospital. Her mood noticeably improved but some meds were too sedating (trazodone, hydroxyzine) and we have stopped both.She is on 10 mg of Trintellix, no side effects, mood stable, good sleep and appetite.Maryno longer takesAmbien, hydroxyzineor buspirone. She is on Adderall for narcolepsy.  Dx: MDD recurrent in remission; Narcolepsy  Plan: Continue Trintellix, Adderallunchanged. Return to clinic in 3 months.The plan was discussed with patient who had an opportunity to ask questions and these were all answered. I spend20 minutes inphone consultation with the patient.    Stephanie Acre, MD 01/15/2020, 3:43 PM

## 2020-03-25 DIAGNOSIS — Z20828 Contact with and (suspected) exposure to other viral communicable diseases: Secondary | ICD-10-CM | POA: Diagnosis not present

## 2020-03-25 DIAGNOSIS — Z03818 Encounter for observation for suspected exposure to other biological agents ruled out: Secondary | ICD-10-CM | POA: Diagnosis not present

## 2020-03-30 IMAGING — CR DG LUMBAR SPINE COMPLETE 4+V
5 series · 5 of 5 positions shown · non-contrast
Comparison: None.

CLINICAL DATA: Lumbago

EXAM:
LUMBAR SPINE - COMPLETE 4+ VIEW

[w lumbar spine ap]
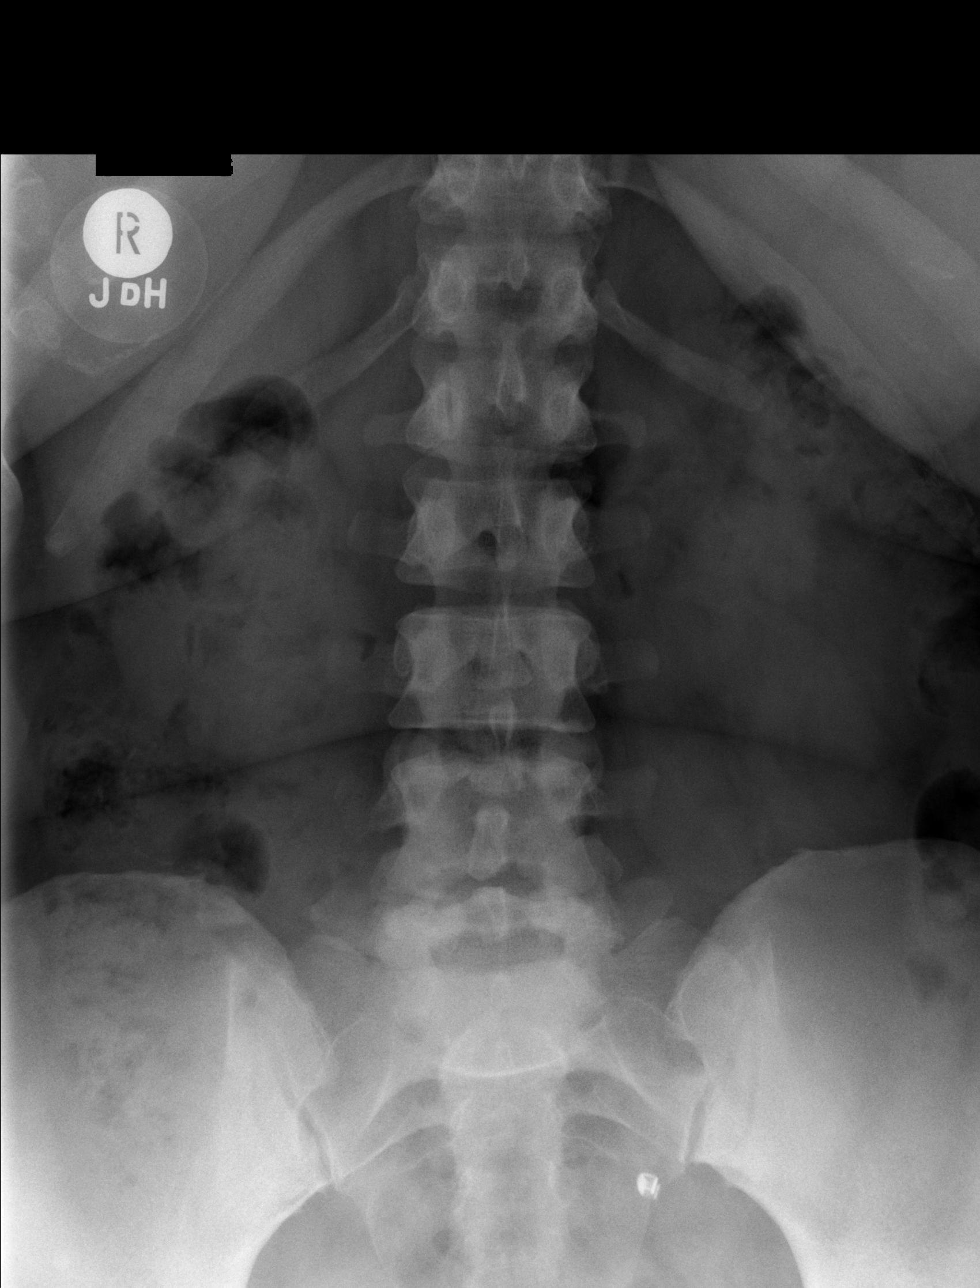

[w lumbar spine obl (1 of 2)]
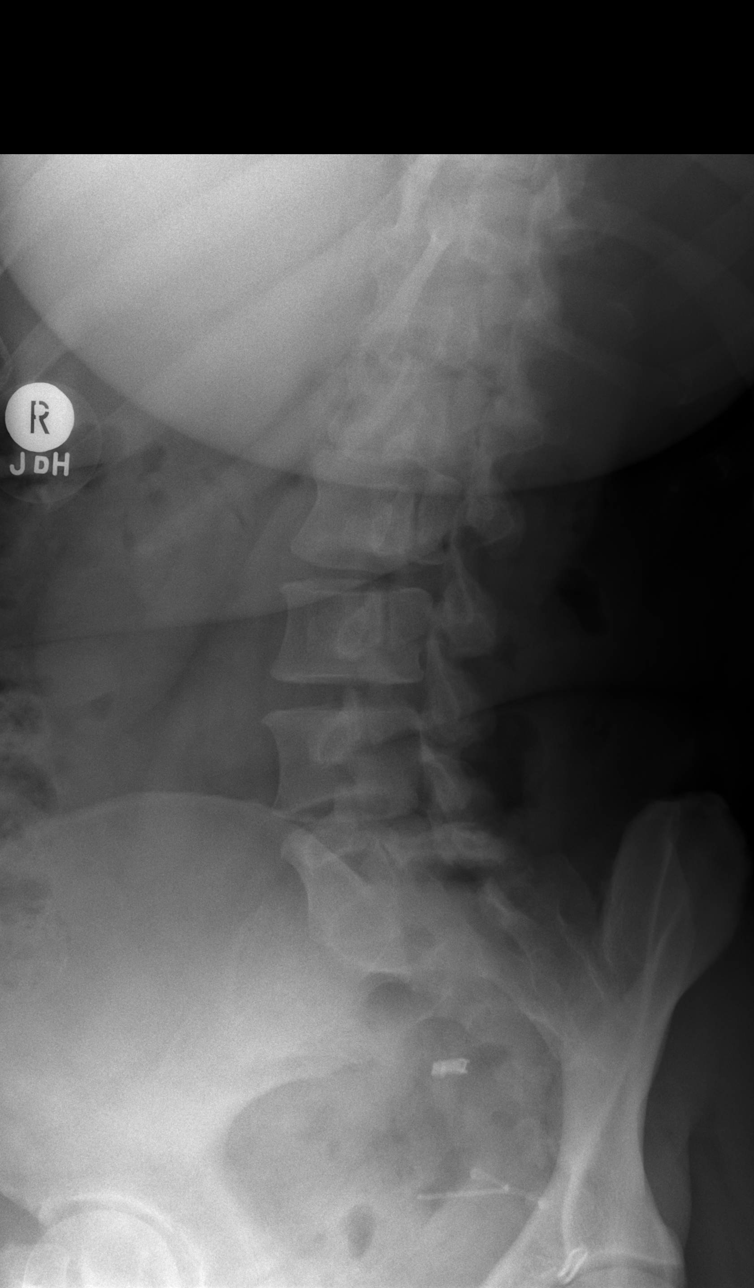

[w lumbar spine obl (2 of 2)]
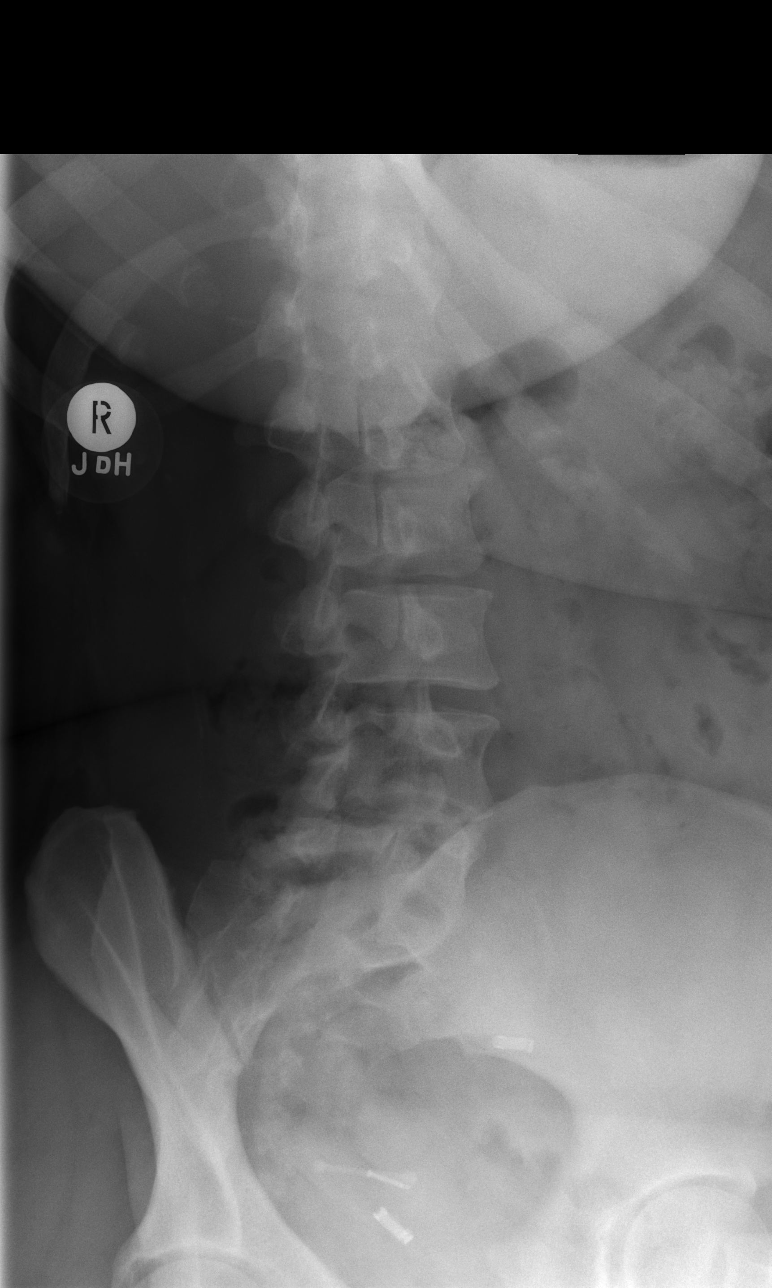

[w lumbar spine lat]
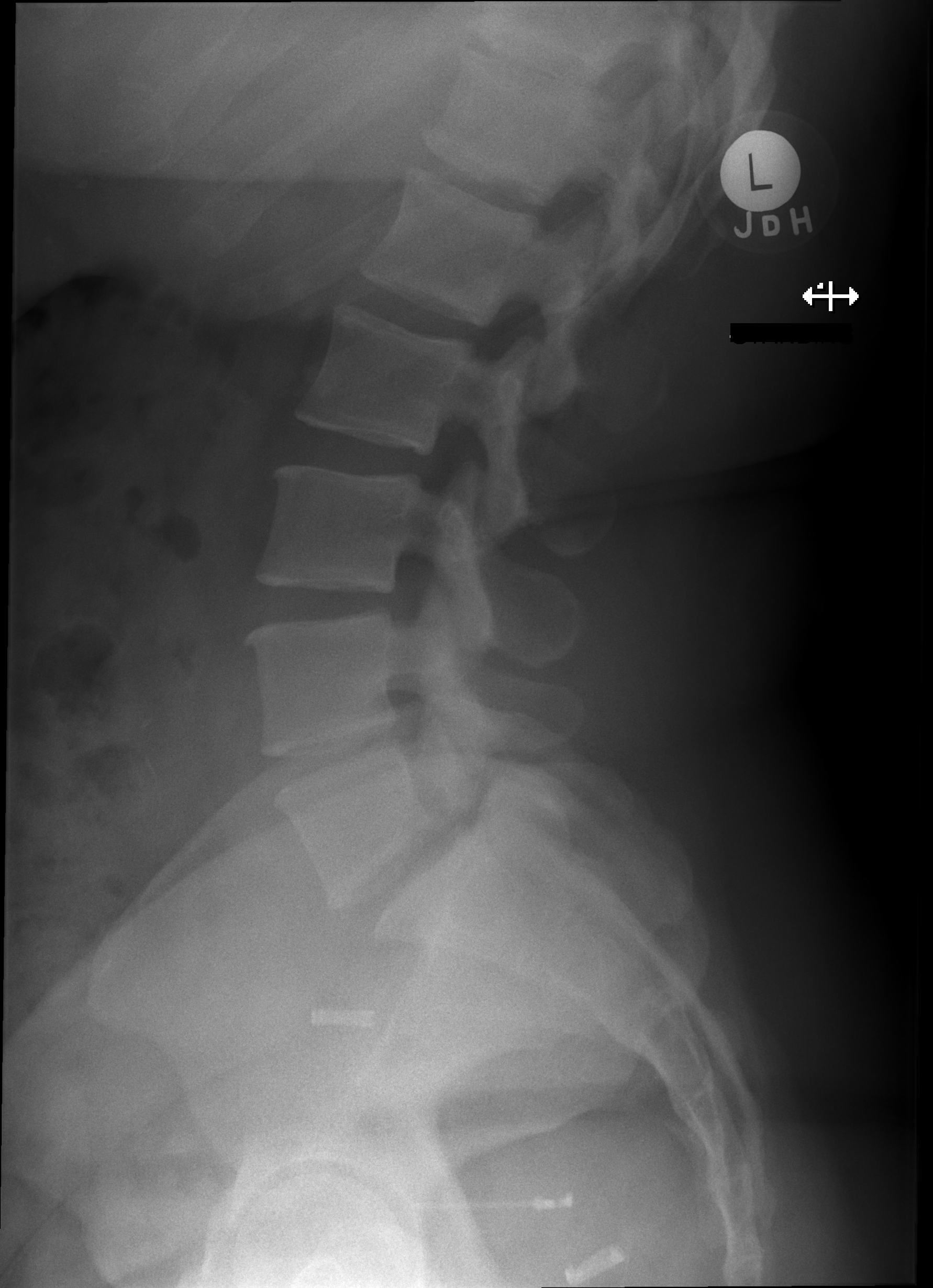

[w lumbar l-5 s-1 spot]
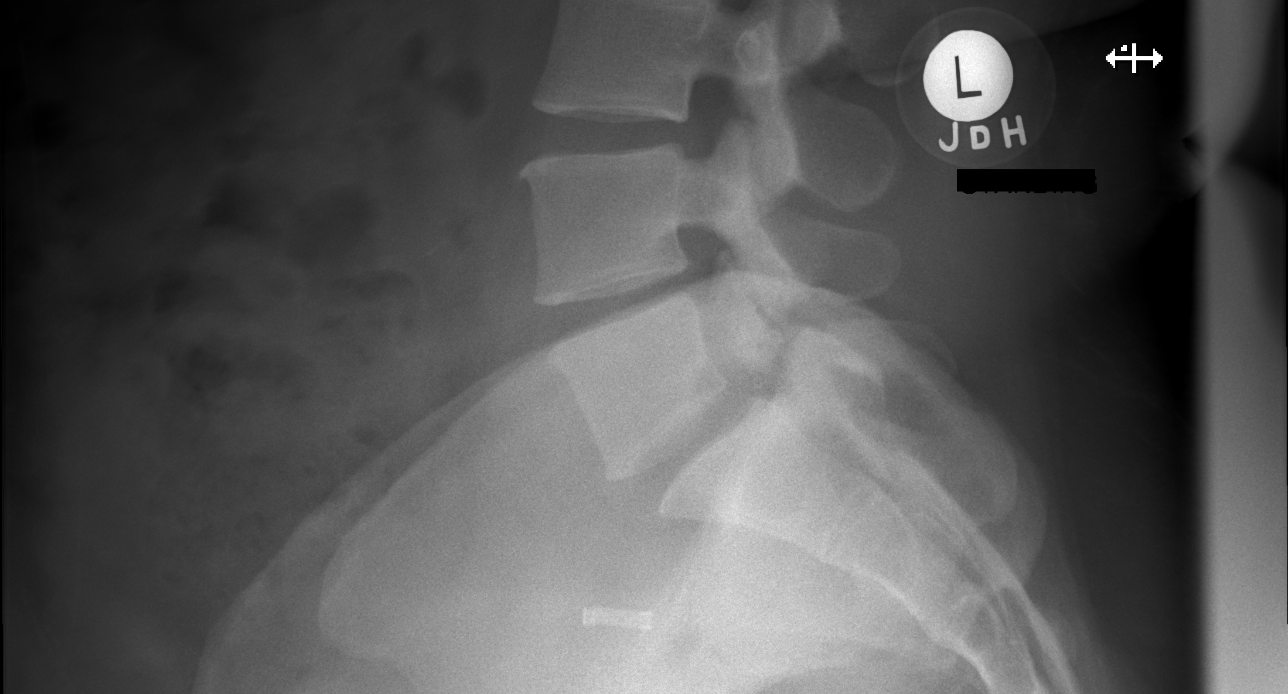

[5 of 5 positions shown; findings below may reference images not displayed]

FINDINGS: Standing frontal, standing lateral, standing spot lumbosacral
lateral, and standing bilateral oblique views were obtained. There
are 5 non-rib-bearing lumbar type vertebral bodies. There is no
fracture. There are pars defects at L5 bilaterally. There is 5 mm of
anterolisthesis of L5 on S1. There is no spondylolisthesis
elsewhere. There is mild to moderate disc space narrowing at L4-5.
There is slight disc space narrowing at L5-S1. Other disc spaces
appear unremarkable. There is no appreciable facet arthropathy.

There is an intrauterine device in the mid-pelvis. There are tubal
ligation clips bilaterally.
IMPRESSION: Spondylolisthesis at L5-S1 with pars defects at L5 bilaterally. No
other spondylolisthesis. No fracture.

Mild-to-moderate disc space narrowing at L4-5 with mild disc space
narrowing at L5-S1. Other disc spaces appear unremarkable.

Tubal ligation clips present on each side. Intrauterine device
present in mid pelvis.

## 2020-04-16 ENCOUNTER — Telehealth (INDEPENDENT_AMBULATORY_CARE_PROVIDER_SITE_OTHER): Payer: BC Managed Care – PPO | Admitting: Psychiatry

## 2020-04-16 ENCOUNTER — Other Ambulatory Visit: Payer: Self-pay

## 2020-04-16 DIAGNOSIS — F3342 Major depressive disorder, recurrent, in full remission: Secondary | ICD-10-CM

## 2020-04-16 DIAGNOSIS — G47419 Narcolepsy without cataplexy: Secondary | ICD-10-CM

## 2020-04-16 MED ORDER — AMPHETAMINE-DEXTROAMPHETAMINE 10 MG PO TABS: 10.0000 mg | ORAL_TABLET | Freq: Three times a day (TID) | ORAL | 0 refills | Status: DC

## 2020-04-16 NOTE — Progress Notes (Signed)
BH MD/PA/NP OP Progress Note  04/16/2020 3:16 PM JONNETTE NUON  MRN:  [id number] Interview was conducted by phone and I verified that I was speaking with the correct person using two identifiers. I discussed the limitations of evaluation and management by telemedicine and  the availability of in person appointments. Patient expressed understanding and agreed to proceed. Patient location - home; physician - home office.  Chief Complaint: None.  HPI: 39yo single femalewitha long hx ofmajor depressive disorderand anxiety. She hadbeen initially on fluoxetine and then, for many years, on sertraline.[name] was [employer] stating that she is severely depressed, anxious and having suicidal thoughts. Medications have been changed while in hospital. Her mood noticeably improved but some meds were too sedating (trazodone, hydroxyzine) and we have stopped both.She is on 10 mg of Trintellix, no side effects, mood stable,good sleep and appetite.Maryno longer takesAmbien, hydroxyzineor buspirone. She is on Adderall for narcolepsy.She needs a letter justifying use of vortioxetine to keep getting this antidepressant free of charge. She had previously been/failed fluoxetine and sertraline. Vortioxetine was started while she was hospitalized at [facility] and she has had a very good response to it and tolerates it well. She also had been on buspirone which she no longer needs as vortioxetine also has helped with anxiety.   Visit Diagnosis:    ICD-10-CM   1. Major depressive disorder, recurrent episode, in full remission (H[facility])  F33.42   2. Primary narcolepsy without cataplexy  G47.419     Past Psychiatric History: Please see intake H&P.  Past Medical History:  Past Medical History:  Diagnosis Date  . Carpal tunnel syndrome, right 10/2012  . Headache(784.0)    tension; migraines 1-2 x/month    Past Surgical History:  Procedure Laterality Date  . ANKLE  FRACTURE SURGERY    . APPENDECTOMY  02/26/2008   laparoscopic  . CARPAL TUNNEL RELEASE  10/11/2012   Procedure: CARPAL TUNNEL RELEASE;  Surgeon: Dominica Severin, MD;  Location: [facility];  Service: Orthopedics;  Laterality: Right;  . TUBAL LIGATION  01/27/2011   laparoscopic    Family Psychiatric History: None.  Family History: No family history on file.  Social History:  Social History   Socioeconomic History  . Marital status: Single    Spouse name: Not on file  . Number of children: Not on file  . Years of education: Not on file  . Highest education level: Not on file  Occupational History  . Not on file  Tobacco Use  . Smoking status: Never Smoker  . Smokeless tobacco: Never Used  Substance and Sexual Activity  . Alcohol use: No  . Drug use: No  . Sexual activity: Not Currently  Other Topics Concern  . Not on file  Social History Narrative  . Not on file   Social Determinants of Health   Financial Resource Strain:   . Difficulty of Paying Living Expenses:   Food Insecurity:   . Worried About Programme researcher, broadcasting/film/video in the Last Year:   . Barista in the Last Year:   Transportation Needs:   . Freight forwarder (Medical):   [name] Lack of Transportation (Non-Medical):   Physical Activity:   . Days of Exercise per Week:   . Minutes of Exercise per Session:   Stress:   . Feeling of Stress :   Social Connections:   . Frequency of Communication with Friends and Family:   . Frequency of Social Gatherings with Friends  and Family:   . Attends Religious Services:   . Active Member of Clubs or Organizations:   . Attends Banker Meetings:   [name] Marital Status:     Allergies:  Allergies  Allergen Reactions  . Aspirin Other (See Comments)    NOSEBLEEDS    Metabolic Disorder Labs: Lab Results  Component Value Date   HGBA1C 5.6 12/06/2018   MPG 114.02 12/06/2018   No results found for: PROLACTIN Lab Results  Component Value Date    CHOL 147 12/06/2018   TRIG 154 (H) 12/06/2018   HDL 44 12/06/2018   CHOLHDL 3.3 12/06/2018   VLDL 31 12/06/2018   LDLCALC 72 12/06/2018   No results found for: TSH  Therapeutic Level Labs: No results found for: LITHIUM No results found for: VALPROATE No components found for:  CBMZ  Current Medications: Current Outpatient Medications  Medication Sig Dispense Refill  . amphetamine-dextroamphetamine (ADDERALL) 10 MG tablet Take 1 tablet (10 mg total) by mouth 3 (three) times daily after meals. 90 tablet 0  . [START ON 06/10/2020] amphetamine-dextroamphetamine (ADDERALL) 10 MG tablet Take 1 tablet (10 mg total) by mouth 3 (three) times daily after meals. 90 tablet 0  . [START ON 05/10/2020] amphetamine-dextroamphetamine (ADDERALL) 10 MG tablet Take 1 tablet (10 mg total) by mouth 3 (three) times daily after meals. 90 tablet 0  . hydrocortisone 2.5 % cream     . triamcinolone cream (KENALOG) 0.1 % triamcinolone acetonide 0.1 % topical cream    . vortioxetine HBr (TRINTELLIX) 10 MG TABS tablet Take 1 tablet (10 mg total) by mouth daily. 30 tablet 5   No current facility-administered medications for this visit.      Psychiatric Specialty Exam: Review of Systems  All other systems reviewed and are negative.   There were no vitals taken for this visit.There is no height or weight on file to calculate BMI.  General Appearance: NA  Eye Contact:  NA  Speech:  Clear and Coherent and Normal Rate  Volume:  Normal  Mood:  Euthymic  Affect:  NA  Thought Process:  Goal Directed and Linear  Orientation:  Full (Time, Place, and Person)  Thought Content: Logical   Suicidal Thoughts:  No  Homicidal Thoughts:  No  Memory:  Immediate;   Good Recent;   Good Remote;   Good  Judgement:  Good  Insight:  Good  Psychomotor Activity:  NA  Concentration:  Concentration: Good  Recall:  Good  Fund of Knowledge: Good  Language: Good  Akathisia:  Negative  Handed:  Right  AIMS (if indicated): not  done  Assets:  Communication Skills Desire for Improvement Financial Resources/Insurance Housing  ADL's:  Intact  Cognition: WNL  Sleep:  Good   Screenings: AIMS     Admission (Discharged) [facility]  AIMS Total Score 0    AUDIT     Admission (Discharged) from [facility]  Alcohol Use Disorder Identification Test Final Score (AUDIT) 0       Assessment and Plan: 38yo single femalewitha long hx ofmajor depressive disorderand anxiety. She hadbeen initially on fluoxetine and then, for many years, on sertraline.[name] was [employer] stating that she is severely depressed, anxious and having suicidal thoughts. Medications have been changed while in hospital. Her mood noticeably improved but some meds were too sedating (trazodone, hydroxyzine) and we have stopped both.She is on 10  mg of Trintellix, no side effects, mood stable,good sleep and appetite.Maryno longer takesAmbien, hydroxyzineor buspirone. She is on Adderall for narcolepsy.She needs a letter justifying use of vortioxetine to keep getting this antidepressant free of charge. She had previously been/failed fluoxetine and sertraline. Vortioxetine was started while she was hospitalized at [facility] (February 2020) and she has had a very good response to it and tolerates it well. She also had been on buspirone which she no longer needs as vortioxetine also has helped with anxiety.   Dx: MDD recurrent in remission; Narcolepsy  Plan: Continue Trintellix, Adderallunchanged. She is asking for a letter justifying use of vortioxetine (also naming other medications tried) to be send to Dr. Laural Benes at [email or web address]. Return to clinic in 3 months.The plan was discussed with patient who had an opportunity to ask questions and these were all answered. I spend20  minutes inphone consultation with the patient.    Stephanie Acre, MD 04/16/2020, 3:16 PM

## 2020-04-30 ENCOUNTER — Encounter (HOSPITAL_COMMUNITY): Payer: Self-pay

## 2020-05-05 DIAGNOSIS — F4323 Adjustment disorder with mixed anxiety and depressed mood: Secondary | ICD-10-CM | POA: Diagnosis not present

## 2020-06-18 ENCOUNTER — Other Ambulatory Visit (HOSPITAL_COMMUNITY): Payer: Self-pay | Admitting: *Deleted

## 2020-06-18 MED ORDER — VORTIOXETINE HBR 10 MG PO TABS: 10.0000 mg | ORAL_TABLET | Freq: Every day | ORAL | 0 refills | Status: DC

## 2020-07-07 DIAGNOSIS — Z1231 Encounter for screening mammogram for malignant neoplasm of breast: Secondary | ICD-10-CM | POA: Diagnosis not present

## 2020-07-20 ENCOUNTER — Other Ambulatory Visit: Payer: Self-pay

## 2020-07-20 ENCOUNTER — Telehealth (INDEPENDENT_AMBULATORY_CARE_PROVIDER_SITE_OTHER): Payer: BC Managed Care – PPO | Admitting: Psychiatry

## 2020-07-20 DIAGNOSIS — G47419 Narcolepsy without cataplexy: Secondary | ICD-10-CM

## 2020-07-20 DIAGNOSIS — F3342 Major depressive disorder, recurrent, in full remission: Secondary | ICD-10-CM | POA: Diagnosis not present

## 2020-07-20 MED ORDER — AMPHETAMINE-DEXTROAMPHETAMINE 10 MG PO TABS: 10.0000 mg | ORAL_TABLET | Freq: Three times a day (TID) | ORAL | 0 refills | Status: DC

## 2020-07-20 MED ORDER — VORTIOXETINE HBR 10 MG PO TABS: 10.0000 mg | ORAL_TABLET | Freq: Every day | ORAL | 1 refills | Status: AC

## 2020-07-20 NOTE — Progress Notes (Signed)
BH MD/PA/NP OP Progress Note  07/20/2020 3:38 PM Tara Vega  MRN:  [id number] Interview was conducted by phone and I verified that I was speaking with the correct person using two identifiers. I discussed the limitations of evaluation and management by telemedicine and  the availability of in person appointments. Patient expressed understanding and agreed to proceed. Patient location - home; physician - home office.  Chief Complaint: None.  HPI: 39yo single femalewitha long hx ofmajor depressive disorderand anxiety. She hadbeen initially on fluoxetine and then, for many years, on sertraline.Tara Vega was [employer] stating that she is severely depressed, anxious and having suicidal thoughts. Medications have been changed while in hospital. She was started on Trintellix and her mood noticeably improved. Some of her meds were too sedating (trazodone, hydroxyzine) and we have stopped both.She is on 10 mg of Trintellix, no side effects, mood stable,good sleep and appetite.Tara Vega longer takesAmbien, hydroxyzineor buspirone. She is on Adderall for narcolepsy.She had previously been/failed fluoxetine and sertraline. Vortioxetine was started while she was hospitalized at [facility] (February 2020) and she has had a very good response to it and tolerates it well. She also had been on buspirone which she no longer needs as vortioxetine also has helped with anxiety.     Visit Diagnosis:    ICD-10-CM   1. Primary narcolepsy without cataplexy  G47.419   2. Major depressive disorder, recurrent episode, in full remission (HCC)  F33.42     Past Psychiatric History: Please see intake H&P.  Past Medical History:  Past Medical History:  Diagnosis Date  . Carpal tunnel syndrome, right 10/2012  . Headache(784.0)    tension; migraines 1-2 x/month    Past Surgical History:  Procedure Laterality Date  . ANKLE FRACTURE SURGERY    . APPENDECTOMY  02/26/2008    laparoscopic  . CARPAL TUNNEL RELEASE  10/11/2012   Procedure: CARPAL TUNNEL RELEASE;  Surgeon: Tara Severin, MD;  Location: [facility];  Service: Orthopedics;  Laterality: Right;  . TUBAL LIGATION  01/27/2011   laparoscopic    Family Psychiatric History: None.  Family History: No family history on file.  Social History:  Social History   Socioeconomic History  . Marital status: Single    Spouse name: Not on file  . Number of children: Not on file  . Years of education: Not on file  . Highest education level: Not on file  Occupational History  . Not on file  Tobacco Use  . Smoking status: Never Smoker  . Smokeless tobacco: Never Used  Substance and Sexual Activity  . Alcohol use: No  . Drug use: No  . Sexual activity: Not Currently  Other Topics Concern  . Not on file  Social History Narrative  . Not on file   Social Determinants of Health   Financial Resource Strain:   . Difficulty of Paying Living Expenses: Not on file  Food Insecurity:   . Worried About Programme researcher, broadcasting/film/video in the Last Year: Not on file  . Ran Out of Food in the Last Year: Not on file  Transportation Needs:   . Lack of Transportation (Medical): Not on file  . Lack of Transportation (Non-Medical): Not on file  Physical Activity:   . Days of Exercise per Week: Not on file  . Minutes of Exercise per Session: Not on file  Stress:   . Feeling of Stress : Not on file  Social Connections:   . Frequency of Communication with Friends  and Family: Not on file  . Frequency of Social Gatherings with Friends and Family: Not on file  . Attends Religious Services: Not on file  . Active Member of Clubs or Organizations: Not on file  . Attends Banker Meetings: Not on file  . Marital Status: Not on file    Allergies:  Allergies  Allergen Reactions  . Aspirin Other (See Comments)    NOSEBLEEDS    Metabolic Disorder Labs: Lab Results  Component Value Date   HGBA1C 5.6  12/06/2018   MPG 114.02 12/06/2018   No results found for: PROLACTIN Lab Results  Component Value Date   CHOL 147 12/06/2018   TRIG 154 (H) 12/06/2018   HDL 44 12/06/2018   CHOLHDL 3.3 12/06/2018   VLDL 31 12/06/2018   LDLCALC 72 12/06/2018   No results found for: TSH  Therapeutic Level Labs: No results found for: LITHIUM No results found for: VALPROATE No components found for:  CBMZ  Current Medications: Current Outpatient Medications  Medication Sig Dispense Refill  . amphetamine-dextroamphetamine (ADDERALL) 10 MG tablet Take 1 tablet (10 mg total) by mouth in the morning, at noon, and at bedtime. 90 tablet 0  . [START ON 08/19/2020] amphetamine-dextroamphetamine (ADDERALL) 10 MG tablet Take 1 tablet (10 mg total) by mouth in the morning, at noon, and at bedtime. 90 tablet 0  . [START ON 09/18/2020] amphetamine-dextroamphetamine (ADDERALL) 10 MG tablet Take 1 tablet (10 mg total) by mouth in the morning, at noon, and at bedtime. 90 tablet 0  . hydrocortisone 2.5 % cream     . triamcinolone cream (KENALOG) 0.1 % triamcinolone acetonide 0.1 % topical cream    . vortioxetine HBr (TRINTELLIX) 10 MG TABS tablet Take 1 tablet (10 mg total) by mouth daily. 90 tablet 1   No current facility-administered medications for this visit.     Psychiatric Specialty Exam: Review of Systems  All other systems reviewed and are negative.   There were no vitals taken for this visit.There is no height or weight on file to calculate BMI.  General Appearance: NA  Eye Contact:  NA  Speech:  Clear and Coherent and Normal Rate  Volume:  Normal  Mood:  Euthymic  Affect:  NA  Thought Process:  Goal Directed  Orientation:  Full (Time, Place, and Person)  Thought Content: Logical   Suicidal Thoughts:  No  Homicidal Thoughts:  No  Memory:  Immediate;   Good Recent;   Good Remote;   Good  Judgement:  Good  Insight:  Good  Psychomotor Activity:  NA  Concentration:  Concentration: Good   Recall:  Good  Fund of Knowledge: Good  Language: Good  Akathisia:  Negative  Handed:  Right  AIMS (if indicated): not done  Assets:  Communication Skills Desire for Improvement Financial Resources/Insurance Housing Talents/Skills  ADL's:  Intact  Cognition: WNL  Sleep:  Good   Screenings: AIMS     Ad[facility] (Discharged) [facility]  AIMS Total Score 0    AUDIT     Admission (Discharged) from [facility]  Alcohol Use Disorder Identification Test Final Score (AUDIT) 0       Assessment and Plan: 38yo single femalewitha long hx ofmajor depressive disorderand anxiety. She hadbeen initially on fluoxetine and then, for many years, on sertraline.Tara Vega was [employer] stating that she is severely depressed, anxious and having suicidal thoughts.  Medications have been changed while in hospital. She was started on Trintellix and her mood noticeably improved. Some of her meds were too sedating (trazodone, hydroxyzine) and we have stopped both.She is on 10 mg of Trintellix, no side effects, mood stable,good sleep and appetite.Tara Vega longer takesAmbien, hydroxyzineor buspirone. She is on Adderall for narcolepsy.She had previously been/failed fluoxetine and sertraline. Vortioxetine was started while she was hospitalized at [facility] (February 2020) and she has had a very good response to it and tolerates it well. She also had been on buspirone which she no longer needs as vortioxetine also has helped with anxiety.   Dx: MDD recurrent in remission; Narcolepsy  Plan: Continue Trintellix 10 mg and  Adderall10 mg tid unchanged. Return to clinic in 3 months.The plan was discussed with patient who had an opportunity to ask questions and these were all answered. I spend44minutes inphone consultation with the  patient.    Magdalene Patricia, MD 07/20/2020, 3:38 PM

## 2020-07-24 ENCOUNTER — Other Ambulatory Visit: Payer: Self-pay | Admitting: Obstetrics and Gynecology

## 2020-07-24 DIAGNOSIS — I7300 Raynaud's syndrome without gangrene: Secondary | ICD-10-CM

## 2020-07-24 DIAGNOSIS — G47411 Narcolepsy with cataplexy: Secondary | ICD-10-CM

## 2020-07-24 DIAGNOSIS — R1013 Epigastric pain: Secondary | ICD-10-CM

## 2020-07-24 DIAGNOSIS — E559 Vitamin D deficiency, unspecified: Secondary | ICD-10-CM

## 2020-07-24 DIAGNOSIS — M545 Low back pain, unspecified: Secondary | ICD-10-CM

## 2020-07-24 DIAGNOSIS — F3341 Major depressive disorder, recurrent, in partial remission: Secondary | ICD-10-CM

## 2020-07-24 DIAGNOSIS — R197 Diarrhea, unspecified: Secondary | ICD-10-CM

## 2020-07-31 ENCOUNTER — Ambulatory Visit
Admission: RE | Admit: 2020-07-31 | Discharge: 2020-07-31 | Disposition: A | Discharge status: Home / Self Care | Payer: BC Managed Care – PPO | Source: Ambulatory Visit | Attending: Obstetrics and Gynecology | Admitting: Obstetrics and Gynecology

## 2020-07-31 DIAGNOSIS — F3341 Major depressive disorder, recurrent, in partial remission: Secondary | ICD-10-CM

## 2020-07-31 DIAGNOSIS — G47411 Narcolepsy with cataplexy: Secondary | ICD-10-CM

## 2020-07-31 DIAGNOSIS — M545 Low back pain, unspecified: Secondary | ICD-10-CM

## 2020-07-31 DIAGNOSIS — R197 Diarrhea, unspecified: Secondary | ICD-10-CM

## 2020-07-31 DIAGNOSIS — R1013 Epigastric pain: Secondary | ICD-10-CM

## 2020-07-31 DIAGNOSIS — I7300 Raynaud's syndrome without gangrene: Secondary | ICD-10-CM

## 2020-07-31 DIAGNOSIS — E559 Vitamin D deficiency, unspecified: Secondary | ICD-10-CM

## 2020-10-19 ENCOUNTER — Other Ambulatory Visit: Payer: Self-pay

## 2020-10-19 ENCOUNTER — Telehealth (INDEPENDENT_AMBULATORY_CARE_PROVIDER_SITE_OTHER): Payer: BC Managed Care – PPO | Admitting: Psychiatry

## 2020-10-19 DIAGNOSIS — F3342 Major depressive disorder, recurrent, in full remission: Secondary | ICD-10-CM

## 2020-10-19 DIAGNOSIS — G47419 Narcolepsy without cataplexy: Secondary | ICD-10-CM

## 2020-10-19 MED ORDER — AMPHETAMINE-DEXTROAMPHETAMINE 10 MG PO TABS: 10.0000 mg | ORAL_TABLET | Freq: Three times a day (TID) | ORAL | 0 refills | Status: DC

## 2020-10-19 NOTE — Progress Notes (Addendum)
BH MD/PA/NP OP Progress Note  10/19/2020 3:38 PM Tara Vega  MRN:  [id number] Interview was conducted by phone and I verified that I was speaking with the correct person using two identifiers. I discussed the limitations of evaluation and management by telemedicine and  the availability of in person appointments. Patient expressed understanding and agreed to proceed. Participants in the visit: patient (location - home); physician (location - home office).  Chief Complaint: None.  HPI: 39yo single femalewitha long hx ofmajor depressive disorderand anxiety. She hadbeen initially on fluoxetine and then, for many years, on sertraline.Tara Vega February2020 [facility] stating that she is severely depressed, anxious and having suicidal thoughts. Medications have been changed while in hospital. She was started on Trintellix and her mood noticeably improved. Some of her meds were too sedating (trazodone, hydroxyzine) and we have stopped both.She is on 10 mg of Trintellix, no side effects, mood stable,good sleep and appetite.Maryno longer takesAmbien, hydroxyzineor buspirone. She is on Adderall for narcolepsy.She had previously been/failed fluoxetine and sertraline. Vortioxetine was started while she was hospitalized at [facility] (February 2020) and she has had a very good response to it and tolerates it well.   .Visit Diagnosis:    ICD-10-CM   1. Major depressive disorder, recurrent episode, in full remission (HCC)  F33.42   2. Primary narcolepsy without cataplexy  G47.419     Past Psychiatric History: Please see intake H&P.  Past Medical History:  Past Medical History:  Diagnosis Date  . Carpal tunnel syndrome, right 10/2012  . Headache(784.0)    tension; migraines 1-2 x/month    Past Surgical History:  Procedure Laterality Date  . ANKLE FRACTURE SURGERY    . APPENDECTOMY  02/26/2008   laparoscopic  . CARPAL TUNNEL RELEASE  10/11/2012    Procedure: CARPAL TUNNEL RELEASE;  Surgeon: Dominica Severin, MD;  Location: [facility];  Service: Orthopedics;  Laterality: Right;  . TUBAL LIGATION  01/27/2011   laparoscopic    Family Psychiatric History: None.  Family History: No family history on file.  Social History:  Social History   Socioeconomic History  . Marital status: Single    Spouse name: Not on file  . Number of children: Not on file  . Years of education: Not on file  . Highest education level: Not on file  Occupational History  . Not on file  Tobacco Use  . Smoking status: Never Smoker  . Smokeless tobacco: Never Used  Substance and Sexual Activity  . Alcohol use: No  . Drug use: No  . Sexual activity: Not Currently  Other Topics Concern  . Not on file  Social History Narrative  . Not on file   Social Determinants of Health   Financial Resource Strain: Not on file  Food Insecurity: Not on file  Transportation Needs: Not on file  Physical Activity: Not on file  Stress: Not on file  Social Connections: Not on file    Allergies:  Allergies  Allergen Reactions  . Aspirin Other (See Comments)    NOSEBLEEDS    Metabolic Disorder Labs: Lab Results  Component Value Date   HGBA1C 5.6 12/06/2018   MPG 114.02 12/06/2018   No results found for: PROLACTIN Lab Results  Component Value Date   CHOL 147 12/06/2018   TRIG 154 (H) 12/06/2018   HDL 44 12/06/2018   CHOLHDL 3.3 12/06/2018   VLDL 31 12/06/2018   LDLCALC 72 12/06/2018   No results found for: TSH  Therapeutic  Level Labs: No results found for: LITHIUM No results found for: VALPROATE No components found for:  CBMZ  Current Medications: Current Outpatient Medications  Medication Sig Dispense Refill  . amphetamine-dextroamphetamine (ADDERALL) 10 MG tablet Take 1 tablet (10 mg total) by mouth in the morning, at noon, and at bedtime. 90 tablet 0  . [START ON 11/18/2020] amphetamine-dextroamphetamine (ADDERALL) 10 MG tablet  Take 1 tablet (10 mg total) by mouth in the morning, at noon, and at bedtime. 90 tablet 0  . [START ON 12/18/2020] amphetamine-dextroamphetamine (ADDERALL) 10 MG tablet Take 1 tablet (10 mg total) by mouth in the morning, at noon, and at bedtime. 90 tablet 0  . hydrocortisone 2.5 % cream     . triamcinolone cream (KENALOG) 0.1 % triamcinolone acetonide 0.1 % topical cream    . vortioxetine HBr (TRINTELLIX) 10 MG TABS tablet Take 1 tablet (10 mg total) by mouth daily. 90 tablet 1   No current facility-administered medications for this visit.      Psychiatric Specialty Exam: Review of Systems  All other systems reviewed and are negative.   There were no vitals taken for this visit.There is no height or weight on file to calculate BMI.  General Appearance: NA  Eye Contact:  NA  Speech:  Clear and Coherent and Normal Rate  Volume:  Normal  Mood:  Euthymic  Affect:  NA  Thought Process:  Goal Directed and Linear  Orientation:  Full (Time, Place, and Person)  Thought Content: Logical   Suicidal Thoughts:  No  Homicidal Thoughts:  No  Memory:  Immediate;   Good Recent;   Good Remote;   Good  Judgement:  Good  Insight:  Good  Psychomotor Activity:  NA  Concentration:  Concentration: Good  Recall:  Good  Fund of Knowledge: Good  Language: Good  Akathisia:  Negative  Handed:  Right  AIMS (if indicated): not done  Assets:  Communication Skills Desire for Improvement Financial Resources/Insurance Housing Talents/Skills  ADL's:  Intact  Cognition: WNL  Sleep:  Good   Screenings: AIMS   Flowsheet Row Admission (Discharged) [facility]  AIMS Total Score 0    AUDIT   Flowsheet Row Admission (Discharged) from [facility]  Alcohol Use Disorder Identification Test Final Score (AUDIT) 0       Assessment and Plan: 39yo single femalewitha long hx  ofmajor depressive disorderand anxiety. She hadbeen initially on fluoxetine and then, for many years, on sertraline.Tara Vega February2020 [facility] stating that she is severely depressed, anxious and having suicidal thoughts. Medications have been changed while in hospital. She was started on Trintellix and her mood noticeably improved. Some of her meds were too sedating (trazodone, hydroxyzine) and we have stopped both.She is on 10 mg of Trintellix, no side effects, mood stable,good sleep and appetite.Maryno longer takesAmbien, hydroxyzineor buspirone. She is on Adderall for narcolepsy.She had previously been/failed fluoxetine and sertraline. Vortioxetine was started while she was hospitalized at [facility] and she has had a very good response to it and tolerates it well.   Dx: MDD recurrent in remission; Narcolepsy  Plan: Continue Trintellix 10 mg and  Adderall10 mg tid unchanged.Return to clinic in 3 months.The plan was discussed with patient who had an opportunity to ask questions and these were all answered. I spend8minutes inphone consultation with the patient   Magdalene Patricia, MD 10/19/2020, 3:38 PM

## 2021-01-19 ENCOUNTER — Telehealth (INDEPENDENT_AMBULATORY_CARE_PROVIDER_SITE_OTHER): Payer: BC Managed Care – PPO | Admitting: Psychiatry

## 2021-01-19 ENCOUNTER — Other Ambulatory Visit: Payer: Self-pay

## 2021-01-19 DIAGNOSIS — F3342 Major depressive disorder, recurrent, in full remission: Secondary | ICD-10-CM

## 2021-01-19 DIAGNOSIS — G47419 Narcolepsy without cataplexy: Secondary | ICD-10-CM | POA: Diagnosis not present

## 2021-01-19 MED ORDER — AMPHETAMINE-DEXTROAMPHETAMINE 10 MG PO TABS: 10.0000 mg | ORAL_TABLET | Freq: Three times a day (TID) | ORAL | 0 refills | Status: DC

## 2021-01-19 MED ORDER — VORTIOXETINE HBR 10 MG PO TABS: 10.0000 mg | ORAL_TABLET | Freq: Every day | ORAL | 1 refills | Status: DC

## 2021-01-19 NOTE — Progress Notes (Signed)
BH MD/PA/NP OP Progress Note  01/19/2021 3:41 PM Tara Vega  MRN:  [id number] Interview was conducted by phone and I verified that I was speaking with the correct person using two identifiers. I discussed the limitations of evaluation and management by telemedicine and  the availability of in person appointments. Patient expressed understanding and agreed to proceed. Participants in the visit: patient (location - home); physician (location - home office).  Chief Complaint: None.  HPI: 40yo single femalewitha long hx ofmajor depressive disorderand anxiety. She hadbeen initially on fluoxetine and then, for many years, on sertraline.Tara Vega February2020 she was [facility] stating that she is severely depressed, anxious and having suicidal thoughts. Medications have been changed while in hospital.She was started on Trintellix and her mood noticeably improved. Some of hermeds were too sedating (trazodone, hydroxyzine) and we have stopped both.She is on 10 mg of Trintellix, no side effects, mood stable,good sleep and appetite.Maryno longer takesAmbien, hydroxyzineor buspirone. She is on Adderall for narcolepsy.    Visit Diagnosis:    ICD-10-CM   1. Major depressive disorder, recurrent episode, in full remission (HCC)  F33.42   2. Primary narcolepsy without cataplexy  G47.419     Past Psychiatric History: Please see intake H&P.  Past Medical History:  Past Medical History:  Diagnosis Date  . Carpal tunnel syndrome, right 10/2012  . Headache(784.0)    tension; migraines 1-2 x/month    Past Surgical History:  Procedure Laterality Date  . ANKLE FRACTURE SURGERY    . APPENDECTOMY  02/26/2008   laparoscopic  . CARPAL TUNNEL RELEASE  10/11/2012   Procedure: CARPAL TUNNEL RELEASE;  Surgeon: Dominica Severin, MD;  Location: [facility];  Service: Orthopedics;  Laterality: Right;  . TUBAL LIGATION  01/27/2011   laparoscopic    Family Psychiatric  History: None.  Family History: No family history on file.  Social History:  Social History   Socioeconomic History  . Marital status: Single    Spouse name: Not on file  . Number of children: Not on file  . Years of education: Not on file  . Highest education level: Not on file  Occupational History  . Not on file  Tobacco Use  . Smoking status: Never Smoker  . Smokeless tobacco: Never Used  Substance and Sexual Activity  . Alcohol use: No  . Drug use: No  . Sexual activity: Not Currently  Other Topics Concern  . Not on file  Social History Narrative  . Not on file   Social Determinants of Health   Financial Resource Strain: Not on file  Food Insecurity: Not on file  Transportation Needs: Not on file  Physical Activity: Not on file  Stress: Not on file  Social Connections: Not on file    Allergies:  Allergies  Allergen Reactions  . Aspirin Other (See Comments)    NOSEBLEEDS    Metabolic Disorder Labs: Lab Results  Component Value Date   HGBA1C 5.6 12/06/2018   MPG 114.02 12/06/2018   No results found for: PROLACTIN Lab Results  Component Value Date   CHOL 147 12/06/2018   TRIG 154 (H) 12/06/2018   HDL 44 12/06/2018   CHOLHDL 3.3 12/06/2018   VLDL 31 12/06/2018   LDLCALC 72 12/06/2018   No results found for: TSH  Therapeutic Level Labs: No results found for: LITHIUM No results found for: VALPROATE No components found for:  CBMZ  Current Medications: Current Outpatient Medications  Medication Sig Dispense Refill  . amphetamine-dextroamphetamine (ADDERALL) 10  MG tablet Take 1 tablet (10 mg total) by mouth 3 (three) times daily. 90 tablet 0  . [START ON 02/18/2021] amphetamine-dextroamphetamine (ADDERALL) 10 MG tablet Take 1 tablet (10 mg total) by mouth 3 (three) times daily. 90 tablet 0  . [START ON 03/20/2021] amphetamine-dextroamphetamine (ADDERALL) 10 MG tablet Take 1 tablet (10 mg total) by mouth 3 (three) times daily. 90 tablet 0  .  vortioxetine HBr (TRINTELLIX) 10 MG TABS tablet Take 1 tablet (10 mg total) by mouth daily. 90 tablet 1  . hydrocortisone 2.5 % cream     . triamcinolone cream (KENALOG) 0.1 % triamcinolone acetonide 0.1 % topical cream     No current facility-administered medications for this visit.      Psychiatric Specialty Exam: Review of Systems  All other systems reviewed and are negative.   There were no vitals taken for this visit.There is no height or weight on file to calculate BMI.  General Appearance: NA  Eye Contact:  NA  Speech:  Clear and Coherent and Normal Rate  Volume:  Normal  Mood:  Euthymic  Affect:  NA  Thought Process:  Goal Directed and Linear  Orientation:  Full (Time, Place, and Person)  Thought Content: Logical   Suicidal Thoughts:  No  Homicidal Thoughts:  No  Memory:  Immediate;   Good Recent;   Good Remote;   Good  Judgement:  Good  Insight:  Good  Psychomotor Activity:  NA  Concentration:  Concentration: Good  Recall:  Good  Fund of Knowledge: Good  Language: Good  Akathisia:  Negative  Handed:  Right  AIMS (if indicated): not done  Assets:  Communication Skills Desire for Improvement Financial Resources/Insurance Housing Social Support Talents/Skills  ADL's:  Intact  Cognition: WNL  Sleep:  Good   Screenings: AIMS   Flowsheet Row Admission (Discharged) [facility]  AIMS Total Score 0    AUDIT   Flowsheet Row Admission (Discharged) from [facility]  Alcohol Use Disorder Identification Test Final Score (AUDIT) 0    Flowsheet Row Admission (Discharged) from [facility]  C-SSRS RISK CATEGORY Low Risk       Assessment and Plan: 40yo single femalewitha long hx ofmajor depressive disorderand anxiety. She hadbeen initially on fluoxetine and then, for many  years, on sertraline.Tara Vega February2020 [facility] stating that she is severely depressed, anxious and having suicidal thoughts. Medications have been changed while in hospital.She was started on Trintellix and her mood noticeably improved. Some of hermeds were too sedating (trazodone, hydroxyzine) and we have stopped both.She is on 10 mg of Trintellix, no side effects, mood stable,good sleep and appetite.Maryno longer takesAmbien, hydroxyzineor buspirone. She is on Adderall for narcolepsy.   Dx: MDD recurrent in remission; Narcolepsy  Plan: Continue Trintellix10 mg andAdderall10 mg tidunchanged.Return to clinic in 3 months.The plan was discussed with patient who had an opportunity to ask questions and these were all answered. I spend41minutes inphone consultation with the patient   Magdalene Patricia, MD 01/19/2021, 3:41 PM

## 2021-03-22 ENCOUNTER — Other Ambulatory Visit: Payer: Self-pay

## 2021-03-22 ENCOUNTER — Encounter (HOSPITAL_COMMUNITY): Payer: Self-pay | Admitting: Psychiatry

## 2021-03-22 ENCOUNTER — Telehealth (INDEPENDENT_AMBULATORY_CARE_PROVIDER_SITE_OTHER): Payer: BC Managed Care – PPO | Admitting: Psychiatry

## 2021-03-22 DIAGNOSIS — G47419 Narcolepsy without cataplexy: Secondary | ICD-10-CM | POA: Diagnosis not present

## 2021-03-22 NOTE — Progress Notes (Signed)
BH MD/PA/NP OP Progress Note  03/22/2021 12:57 PM Tara Vega  MRN:  [id number] Interview was conducted by phone and I verified that I was speaking with the correct person using two identifiers. I discussed the limitations of evaluation and management by telemedicine and  the availability of in person appointments. Patient expressed understanding and agreed to proceed. Participants in the visit: patient (location - home); physician (location -clinic office). Duration 25 minutes   Chief Complaint: Medication management appointment.  Patient was being followed by Dr. Hinton Dyer, who has retired. I am providing outpatient psychiatric care pending new psychiatrist starting .  HPI:  40 year old female.  History of depression, anxiety, narcolepsy by history.   History of a psychiatric admission in 2020 for worsening depression.   Currently Tara Vega reports  she has been doing well.   Describes she is functioning well in daily activities , reports mood as stable,presents with full range of affect, does not endorse significant neuro-vegetative symptoms at this time. She reports Trintellix has been effective and well tolerated . Also on Adderall for Narcolepsy. States she has been on Adderall for years , without side effects. Denies misuse /abuse . Side effects reviewed, to include risk of cardiovascular complications , increased BP, anxiety, abuse potential associated with stimulants .   Visit Diagnosis:  MDD by history Narcolepsy by history  Past Psychiatric History: Please see intake H&P.  Past Medical History:  Past Medical History:  Diagnosis Date  . Carpal tunnel syndrome, right 10/2012  . Headache(784.0)    tension; migraines 1-2 x/month    Past Surgical History:  Procedure Laterality Date  . ANKLE FRACTURE SURGERY    . APPENDECTOMY  02/26/2008   laparoscopic  . CARPAL TUNNEL RELEASE  10/11/2012   Procedure: CARPAL TUNNEL RELEASE;  Surgeon: Dominica Severin, MD;  Location:  [facility];  Service: Orthopedics;  Laterality: Right;  . TUBAL LIGATION  01/27/2011   laparoscopic    Family Psychiatric History: None.  Family History: No family history on file.  Social History:  Social History   Socioeconomic History  . Marital status: Single    Spouse name: Not on file  . Number of children: Not on file  . Years of education: Not on file  . Highest education level: Not on file  Occupational History  . Not on file  Tobacco Use  . Smoking status: Never Smoker  . Smokeless tobacco: Never Used  Substance and Sexual Activity  . Alcohol use: No  . Drug use: No  . Sexual activity: Not Currently  Other Topics Concern  . Not on file  Social History Narrative  . Not on file   Social Determinants of Health   Financial Resource Strain: Not on file  Food Insecurity: Not on file  Transportation Needs: Not on file  Physical Activity: Not on file  Stress: Not on file  Social Connections: Not on file    Allergies:  Allergies  Allergen Reactions  . Aspirin Other (See Comments)    NOSEBLEEDS    Metabolic Disorder Labs: Lab Results  Component Value Date   HGBA1C 5.6 12/06/2018   MPG 114.02 12/06/2018   No results found for: PROLACTIN Lab Results  Component Value Date   CHOL 147 12/06/2018   TRIG 154 (H) 12/06/2018   HDL 44 12/06/2018   CHOLHDL 3.3 12/06/2018   VLDL 31 12/06/2018   LDLCALC 72 12/06/2018   No results found for: TSH  Therapeutic Level Labs: No results found for: LITHIUM  No results found for: VALPROATE No components found for:  CBMZ  Current Medications: Current Outpatient Medications  Medication Sig Dispense Refill  . amphetamine-dextroamphetamine (ADDERALL) 10 MG tablet Take 1 tablet (10 mg total) by mouth 3 (three) times daily. 90 tablet 0  . amphetamine-dextroamphetamine (ADDERALL) 10 MG tablet Take 1 tablet (10 mg total) by mouth 3 (three) times daily. 90 tablet 0  . amphetamine-dextroamphetamine (ADDERALL)  10 MG tablet Take 1 tablet (10 mg total) by mouth 3 (three) times daily. 90 tablet 0  . hydrocortisone 2.5 % cream     . triamcinolone cream (KENALOG) 0.1 % triamcinolone acetonide 0.1 % topical cream    . vortioxetine HBr (TRINTELLIX) 10 MG TABS tablet Take 1 tablet (10 mg total) by mouth daily. 90 tablet 1   No current facility-administered medications for this visit.      Psychiatric Specialty Exam: Please note limitations in obtaining a full mental status exam in the context of phone based communication Review of Systems  All other systems reviewed and are negative. None endorsed  There were no vitals taken for this visit.There is no height or weight on file to calculate BMI.  General Appearance: NA  Eye Contact:  NA  Speech:  Normal Rate  Volume:  Normal  Mood: Reports mood as stable/generally improved, denies depression at this time, appears to be euthymic  Affect: Appears full in range  Thought Process:  Goal Directed and Linear  Orientation:  Full (Time, Place, and Person)  Thought Content: No suicidal ideations, no homicidal ideations, no psychotic symptoms  Suicidal Thoughts:  No no SI or self-injurious ideations  Homicidal Thoughts:  No  Memory: Recent and remote grossly intact  Judgement:  present  Insight:  present  Psychomotor Activity:  NA  Concentration:  Concentration: Good  Recall:  Good  Fund of Knowledge: Good  Language: Good  Akathisia:  Negative  Handed:  Right  AIMS (if indicated): not done  Assets:  Communication Skills Desire for Improvement Financial Resources/Insurance Housing Social Support Talents/Skills  ADL's:  Intact  Cognition: WNL  Sleep:  Good   Screenings: AIMS   Flowsheet Row Admission (Discharged) [facility]  AIMS Total Score 0    AUDIT   Flowsheet Row Admission (Discharged) from [facility]  Alcohol  Use Disorder Identification Test Final Score (AUDIT) 0    Flowsheet Row Admission (Discharged) from [facility]  C-SSRS RISK CATEGORY Low Risk       Assessment and Plan:   40 year old female.  History of depression, anxiety, narcolepsy by history.   History of a psychiatric admission in 2020 for worsening depression.   She reports her mood has generally been stable and feels that Trintellix has been an effective and well tolerated medication. Denies medication side effects thus far . She is also on Adderall .  She reports she does not currently need medications refilled. Continue current medication regimen Adderall 10 mgr TID for Narcolepsy Trintellix 10 mgr QDAY  For depression  Did not require medication refills today. Will see in 6-8 weeks. Agrees to contact clinic sooner if any worsening or medication concerns prior   Craige Cotta, MD 03/22/2021, 12:57 PM

## 2021-04-14 DIAGNOSIS — F1210 Cannabis abuse, uncomplicated: Secondary | ICD-10-CM | POA: Diagnosis not present

## 2021-04-22 DIAGNOSIS — F1210 Cannabis abuse, uncomplicated: Secondary | ICD-10-CM | POA: Diagnosis not present

## 2021-05-17 ENCOUNTER — Telehealth (INDEPENDENT_AMBULATORY_CARE_PROVIDER_SITE_OTHER): Payer: BC Managed Care – PPO | Admitting: Psychiatry

## 2021-05-17 ENCOUNTER — Other Ambulatory Visit: Payer: Self-pay

## 2021-05-17 ENCOUNTER — Encounter (HOSPITAL_COMMUNITY): Payer: Self-pay | Admitting: Psychiatry

## 2021-05-17 DIAGNOSIS — F3341 Major depressive disorder, recurrent, in partial remission: Secondary | ICD-10-CM

## 2021-05-17 MED ORDER — VORTIOXETINE HBR 10 MG PO TABS: 10.0000 mg | ORAL_TABLET | Freq: Every day | ORAL | 2 refills | Status: DC

## 2021-05-17 MED ORDER — AMPHETAMINE-DEXTROAMPHETAMINE 10 MG PO TABS: 10.0000 mg | ORAL_TABLET | Freq: Two times a day (BID) | ORAL | 0 refills | Status: DC

## 2021-05-17 NOTE — Progress Notes (Signed)
BH MD/PA/NP OP Progress Note  05/17/2021 11:15 AM Tara Vega  MRN:  [id number] Interview was conducted by phone and I verified that I was speaking with the correct person using two identifiers. I discussed the limitations of evaluation and management by telemedicine and  the availability of in person appointments. Patient expressed understanding and agreed to proceed. Participants in the visit: patient (location - home); physician (location -clinic office). Duration 25 minutes   Chief Complaint: Medication management appointment.  Patient was being followed by Dr. Hinton Dyer, who has retired. I am providing outpatient psychiatric care pending new psychiatrist starting .  HPI:   40 year old female.  History of depression, anxiety, narcolepsy by history.   History of a psychiatric admission in 2020 for worsening depression.   Patient reports she has been stable and generally doing well. 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  She has 4 children.  A recent stressor is that her 4 year old daughter has had persistent abdominal pain and pediatrician was concerned about possible liver compromise ,currently awaiting referral to gastroenterologist.  She describes her mood as improved on Trintellix, which she describes as well-tolerated and effective, stating "it has been the best medication have been on so far" .  Currently describes being euthymic and does not endorse significant neurovegetative symptoms.  Energy level is described as "okay", appetite is good, no anhedonia.  Denies any suicidal ideations.  No psychotic symptoms.  Patient reports she was diagnosed with narcolepsy 4 years ago by her PCP.  She had a sleep study done at that time.  She has been treated with Adderall since then.  She states that this medication has helped and denies any side effects.  She currently feels "tired at times" but denies any falling  asleep during periods of time she needs to be alert, does not endorse cataplexy.  She states she is taking Adderall at 10 mg twice daily rather than 3 times daily on several days of the week and has not noticed any difference at the lower dose.   Visit Diagnosis:  MDD by history Narcolepsy by history  Past Psychiatric History: Please see intake H&P.  Past Medical History:  Past Medical History:  Diagnosis Date   Carpal tunnel syndrome, right 10/2012   Headache(784.0)    tension; migraines 1-2 x/month    Past Surgical History:  Procedure Laterality Date   ANKLE FRACTURE SURGERY     APPENDECTOMY  02/26/2008   laparoscopic   CARPAL TUNNEL RELEASE  10/11/2012   Procedure: CARPAL TUNNEL RELEASE;  Surgeon: Dominica Severin, MD;  Location: [facility];  Service: Orthopedics;  Laterality: Right;   TUBAL LIGATION  01/27/2011   laparoscopic    Family Psychiatric History: None.  Family History: No family history on file.  Social History:  Social History   Socioeconomic History   Marital status: Single    Spouse name: Not on file   Number of children: Not on file   Years of education: Not on file   Highest education level: Not on file  Occupational History   Not on file  Tobacco Use   Smoking status: Never   Smokeless tobacco: Never  Substance and Sexual Activity   Alcohol use: No   Drug use: No   Sexual activity: Not Currently  Other Topics Concern   Not on file  Social History Narrative   Not on file   Social Determinants of Health  Financial Resource Strain: Not on file  Food Insecurity: Not on file  Transportation Needs: Not on file  Physical Activity: Not on file  Stress: Not on file  Social Connections: Not on file    Allergies:  Allergies  Allergen Reactions   Aspirin Other (See Comments)    NOSEBLEEDS    Metabolic Disorder Labs: Lab Results  Component Value Date   HGBA1C 5.6 12/06/2018   MPG 114.02 12/06/2018   No results found for:  PROLACTIN Lab Results  Component Value Date   CHOL 147 12/06/2018   TRIG 154 (H) 12/06/2018   HDL 44 12/06/2018   CHOLHDL 3.3 12/06/2018   VLDL 31 12/06/2018   LDLCALC 72 12/06/2018   No results found for: TSH  Therapeutic Level Labs: No results found for: LITHIUM No results found for: VALPROATE No components found for:  CBMZ  Current Medications: Current Outpatient Medications  Medication Sig Dispense Refill   amphetamine-dextroamphetamine (ADDERALL) 10 MG tablet Take 1 tablet (10 mg total) by mouth 3 (three) times daily. 90 tablet 0   amphetamine-dextroamphetamine (ADDERALL) 10 MG tablet Take 1 tablet (10 mg total) by mouth 3 (three) times daily. 90 tablet 0   amphetamine-dextroamphetamine (ADDERALL) 10 MG tablet Take 1 tablet (10 mg total) by mouth 3 (three) times daily. 90 tablet 0   hydrocortisone 2.5 % cream      triamcinolone cream (KENALOG) 0.1 % triamcinolone acetonide 0.1 % topical cream     vortioxetine HBr (TRINTELLIX) 10 MG TABS tablet Take 1 tablet (10 mg total) by mouth daily. 90 tablet 1   No current facility-administered medications for this visit.      Psychiatric Specialty Exam: Please note limitations in obtaining a full mental status exam in the context of phone based communication Review of Systems  All other systems reviewed and are negative.None endorsed  There were no vitals taken for this visit.There is no height or weight on file to calculate BMI.  General Appearance: NA  Eye Contact:  NA  Speech:  Normal Rate  Volume:  Normal  Mood: Reports mood as stable/euthymic.  Affect: Appears full in range  Thought Process:  Goal Directed and Linear  Orientation:  Full (Time, Place, and Person)  Thought Content: No suicidal ideations, no homicidal ideations, no psychotic symptoms  Suicidal Thoughts:  No no SI or self-injurious ideations  Homicidal Thoughts:  No  Memory: Recent and remote grossly intact  Judgement:  present  Insight:  present   Psychomotor Activity:  NA  Concentration:  Concentration: Good  Recall:  Good  Fund of Knowledge: Good  Language: Good  Akathisia:  Negative  Handed:  Right  AIMS (if indicated): not done  Assets:  Communication Skills Desire for Improvement Financial Resources/Insurance Housing Social Support Talents/Skills  ADL's:  Intact  Cognition: WNL  Sleep:  Good   Screenings: AIMS    Flowsheet Row Admission (Discharged) [facility]  AIMS Total Score 0      AUDIT    Flowsheet Row Admission (Discharged) from [facility]  Alcohol Use Disorder Identification Test Final Score (AUDIT) 0      Flowsheet Row Admission (Discharged) from [facility]  C-SSRS RISK CATEGORY Low Risk        Assessment and Plan:   40 year old female.  History of depression, anxiety, narcolepsy by history.   History  of a psychiatric admission in 2020 for worsening depression.   Currently reports she is doing well.  She describes her mood as stable and presents euthymic.  Does not endorse significant neurovegetative symptoms.  States Trintellix has been effective and well-tolerated. Reports history of narcolepsy which has been managed with Adderall.  States that it is currently well controlled.  She is prescribed 10 mg 3 times daily but reports she is taking Adderall 10 mg twice daily several times of the week without any significant difference and we will continue this dose.  Next appointment in 2 months.  She agrees to contact clinic sooner should there be any concern or worsening prior.  Continue current medication regimen Adderall 10 mgr BID for Narcolepsy Trintellix 10 mgr QDAY for depression   Will see in 6-8 weeks.    Craige Cotta, MD 05/17/2021, 11:15 AM

## 2021-06-17 ENCOUNTER — Other Ambulatory Visit (HOSPITAL_COMMUNITY): Payer: Self-pay | Admitting: Psychiatry

## 2021-06-24 ENCOUNTER — Telehealth (HOSPITAL_COMMUNITY): Payer: Self-pay

## 2021-06-24 NOTE — Telephone Encounter (Signed)
Adderall refill as covering MD

## 2021-06-24 NOTE — Telephone Encounter (Addendum)
This is Dr. Kristen Cardinal patient. Patient called requesting a refill on her Adderall 10mg . It was previously sent to [facility] on [address] [place]. Patient has a scheduled followup appointment on 07/13/21. Please review and advise. Thank you  NOTIFIED PATIENT

## 2021-07-13 ENCOUNTER — Telehealth (INDEPENDENT_AMBULATORY_CARE_PROVIDER_SITE_OTHER): Payer: BC Managed Care – PPO | Admitting: Psychiatry

## 2021-07-13 ENCOUNTER — Encounter (HOSPITAL_COMMUNITY): Payer: Self-pay | Admitting: Psychiatry

## 2021-07-13 ENCOUNTER — Other Ambulatory Visit: Payer: Self-pay

## 2021-07-13 DIAGNOSIS — G47419 Narcolepsy without cataplexy: Secondary | ICD-10-CM

## 2021-07-13 MED ORDER — AMPHETAMINE-DEXTROAMPHETAMINE 10 MG PO TABS: 30.0000 mg | ORAL_TABLET | Freq: Every day | ORAL | 0 refills | Status: DC

## 2021-07-13 MED ORDER — VORTIOXETINE HBR 10 MG PO TABS: 10.0000 mg | ORAL_TABLET | Freq: Every day | ORAL | 1 refills | Status: AC

## 2021-07-13 NOTE — Progress Notes (Signed)
BH MD/PA/NP OP Progress Note  07/13/2021 8:41 AM Tara Vega  MRN:  [id number] Interview was conducted by phone and I verified that I was speaking with the correct person using two identifiers. I discussed the limitations of evaluation and management by telemedicine and  the availability of in person appointments. Patient expressed understanding and agreed to proceed. Participants in the visit: patient (location - home); physician (location -clinic office). Duration 20 minutes   Chief Complaint: Medication management appointment.  Patient was being followed by Dr. Hinton Dyer, who has retired. I am providing outpatient psychiatric care pending new psychiatrist starting .  HPI:   40 year old female.  History of depression, anxiety, narcolepsy by history.   History of a psychiatric admission in 2020 for worsening depression.   She reports she has been generally been doing well , functioning well in her daily activities, and describes mood as stable. Presents euthymic with a full range of affect . Denies SI.  She reports some persistent stressors, particularly regarding an uncle who is currently medically ill and in the hospital. On last visit she had also reported concerns about her daughter's physical health but today reports that thankfully her daughter has been doing better and reports her liver functioning was normal.  She also reports she  is planning on extending her work hours by getting an extra part time job a few times per week in the afternoon.  She does not currently endorse anhedonia or significant neuro-vegetative symptoms. Denies episodes of cataplexia.  With regards to medications she reports that they have been well tolerated, without side effects.  She does want to increase Adderall dose slightly in anticipation of new job/working longer hours a few days a week. Denies side effects. (She had been on Adderall 10 mgr TID previously, which had recently been tapered to 10 mgr BID )     Visit Diagnosis:  MDD by history Narcolepsy by history  Past Psychiatric History: Please see intake H&P.  Past Medical History:  Past Medical History:  Diagnosis Date   Carpal tunnel syndrome, right 10/2012   Headache(784.0)    tension; migraines 1-2 x/month    Past Surgical History:  Procedure Laterality Date   ANKLE FRACTURE SURGERY     APPENDECTOMY  02/26/2008   laparoscopic   CARPAL TUNNEL RELEASE  10/11/2012   Procedure: CARPAL TUNNEL RELEASE;  Surgeon: Dominica Severin, MD;  Location: [facility];  Service: Orthopedics;  Laterality: Right;   TUBAL LIGATION  01/27/2011   laparoscopic    Family Psychiatric History: None.  Family History: No family history on file.  Social History:  Social History   Socioeconomic History   Marital status: Single    Spouse name: Not on file   Number of children: Not on file   Years of education: Not on file   Highest education level: Not on file  Occupational History   Not on file  Tobacco Use   Smoking status: Never   Smokeless tobacco: Never  Substance and Sexual Activity   Alcohol use: No   Drug use: No   Sexual activity: Not Currently  Other Topics Concern   Not on file  Social History Narrative   Not on file   Social Determinants of Health   Financial Resource Strain: Not on file  Food Insecurity: Not on file  Transportation Needs: Not on file  Physical Activity: Not on file  Stress: Not on file  Social Connections: Not on file    Allergies:  Allergies  Allergen Reactions   Aspirin Other (See Comments)    NOSEBLEEDS    Metabolic Disorder Labs: Lab Results  Component Value Date   HGBA1C 5.6 12/06/2018   MPG 114.02 12/06/2018   No results found for: PROLACTIN Lab Results  Component Value Date   CHOL 147 12/06/2018   TRIG 154 (H) 12/06/2018   HDL 44 12/06/2018   CHOLHDL 3.3 12/06/2018   VLDL 31 12/06/2018   LDLCALC 72 12/06/2018   No results found for: TSH  Therapeutic Level  Labs: No results found for: LITHIUM No results found for: VALPROATE No components found for:  CBMZ  Current Medications: Current Outpatient Medications  Medication Sig Dispense Refill   amphetamine-dextroamphetamine (ADDERALL) 10 MG tablet TAKE 1 TABLET BY MOUTH 2 TIMES DAILY 60 tablet 0   hydrocortisone 2.5 % cream      triamcinolone cream (KENALOG) 0.1 % triamcinolone acetonide 0.1 % topical cream     vortioxetine HBr (TRINTELLIX) 10 MG TABS tablet Take 1 tablet (10 mg total) by mouth daily. 30 tablet 2   No current facility-administered medications for this visit.      Psychiatric Specialty Exam: Please note limitations in obtaining a full mental status exam in the context of phone based communication Review of Systems  All other systems reviewed and are negative.None endorsed  There were no vitals taken for this visit.There is no height or weight on file to calculate BMI.  General Appearance: NA  Eye Contact:  NA  Speech:  Normal Rate  Volume:  Normal  Mood: Reports mood as stable/euthymic.  Affect: Appears full in range  Thought Process:  Goal Directed and Linear  Orientation:  Full (Time, Place, and Person)  Thought Content: No suicidal ideations, no homicidal ideations, no psychotic symptoms. Future oriented, describing intention of starting a new job soon  Suicidal Thoughts:  No no SI or self-injurious ideations  Homicidal Thoughts:  No  Memory: Recent and remote grossly intact  Judgement:  present  Insight:  present  Psychomotor Activity:  NA  Concentration:  Concentration: Good  Recall:  Good  Fund of Knowledge: Good  Language: Good  Akathisia:  Negative  Handed:  Right  AIMS (if indicated): not done  Assets:  Communication Skills Desire for Improvement Financial Resources/Insurance Housing Social Support Talents/Skills  ADL's:  Intact  Cognition: WNL  Sleep:  Good   Screenings: AIMS    Flowsheet Row Admission (Discharged) from [facility]  AIMS Total Score 0      AUDIT    Flowsheet Row Admission (Discharged) from [facility]  Alcohol Use Disorder Identification Test Final Score (AUDIT) 0      Flowsheet Row Admission (Discharged) from [facility]  C-SSRS RISK CATEGORY Low Risk        Assessment and Plan:   40 year old female.  History of depression, anxiety, narcolepsy by history.   History of a psychiatric admission in 2020 for worsening depression.   Currently reports she is doing well.  Mood presents stable/euthymic.  Does not endorse significant neurovegetative symptoms.  Reports narcolepsy well controlled with Adderall .  Tolerating Trintellix/Adderall well without side effects. She is interested in increasing Adderall dose slightly as she states she is going to start to work longer/more afternoon hours Will increase dose to 10 mgr QAM /20mg r QPM 2-3 times a week when working longer  hours, on other days will continue 10 mgr BID Continue Trintellix 10 mgr QDAY . Medication side effects have been reviewed   Next appointment in 4-6 weeks  She agrees to contact clinic sooner should there be any concern or worsening prior.      Craige Cotta, MD 07/13/2021, 8:41 AM

## 2021-07-27 ENCOUNTER — Other Ambulatory Visit (HOSPITAL_COMMUNITY): Payer: Self-pay | Admitting: Psychiatry

## 2021-07-28 ENCOUNTER — Other Ambulatory Visit (HOSPITAL_COMMUNITY): Payer: Self-pay | Admitting: Psychiatry

## 2021-07-30 ENCOUNTER — Telehealth (HOSPITAL_COMMUNITY): Payer: Self-pay

## 2021-07-30 NOTE — Telephone Encounter (Signed)
error 

## 2021-08-03 ENCOUNTER — Other Ambulatory Visit (HOSPITAL_COMMUNITY): Payer: Self-pay | Admitting: Psychiatry

## 2021-08-03 MED ORDER — AMPHETAMINE-DEXTROAMPHETAMINE 10 MG PO TABS: 30.0000 mg | ORAL_TABLET | Freq: Every day | ORAL | 0 refills | Status: DC

## 2021-08-23 ENCOUNTER — Telehealth (HOSPITAL_BASED_OUTPATIENT_CLINIC_OR_DEPARTMENT_OTHER): Payer: BC Managed Care – PPO | Admitting: Psychiatry

## 2021-08-23 ENCOUNTER — Encounter (HOSPITAL_COMMUNITY): Payer: Self-pay | Admitting: Psychiatry

## 2021-08-23 ENCOUNTER — Other Ambulatory Visit: Payer: Self-pay

## 2021-08-23 DIAGNOSIS — G47419 Narcolepsy without cataplexy: Secondary | ICD-10-CM | POA: Diagnosis not present

## 2021-08-23 MED ORDER — VORTIOXETINE HBR 10 MG PO TABS: 10.0000 mg | ORAL_TABLET | Freq: Every day | ORAL | 1 refills | Status: DC

## 2021-08-23 MED ORDER — AMPHETAMINE-DEXTROAMPHETAMINE 10 MG PO TABS: 10.0000 mg | ORAL_TABLET | Freq: Two times a day (BID) | ORAL | 0 refills | Status: DC

## 2021-08-23 NOTE — Progress Notes (Signed)
BH MD/PA/NP OP Progress Note  08/23/2021 8:15 AM Tara Vega  MRN:  [id number] Interview was conducted by phone and I verified that I was speaking with the correct person using two identifiers. I discussed the limitations of evaluation and management by telemedicine and  the availability of in person appointments. Patient expressed understanding and agreed to proceed. Participants in the visit: patient (location - home); physician (location -clinic office). Duration 20 minutes   Chief Complaint: Medication management appointment.  Patient was being followed by Dr. Hinton Dyer, who has retired. I am providing outpatient psychiatric care pending new psychiatrist starting .  HPI:   40 year old female.  History of depression, anxiety, narcolepsy by history.   History of a psychiatric admission in 2020 for worsening depression.   Tara Vega reports she has been doing well . Describes mood as stable and denies feeling depressed. Presents euthymic,with a full range of affect. Denies neuro-vegetative symptoms of depression at this time. Denies having any suicidal ideations. Denies any paranoia or hallucinations.  With regards to history of Narcolepsy, reports she has been stable and does not endorse recent episodes of excessive drowsiness. No cataplexia reported  A recent stressor concerned her daughter , who is 74, having possible liver disease. Thankfully, she reports daughter now improved and doing much better .  Patient is now working a new job which she states is going well , the only concern being that she needs to remain standing for long periods of time.  She is tolerating medications well- reports she continues to take Adderall at 10 mgr BID and Trintellix 10 mgr QDAY . States this medication combination is working well for her at present . Reports she had her vitals checked at work a week ago and that her BP was 117/69.  We reviewed side effects, including potential cardiovascular side  effects associated with stimulant medication, risk of serotonin syndrome      Visit Diagnosis:  MDD by history Narcolepsy by history  Past Psychiatric History: Please see intake H&P.  Past Medical History:  Past Medical History:  Diagnosis Date   Carpal tunnel syndrome, right 10/2012   Headache(784.0)    tension; migraines 1-2 x/month    Past Surgical History:  Procedure Laterality Date   ANKLE FRACTURE SURGERY     APPENDECTOMY  02/26/2008   laparoscopic   CARPAL TUNNEL RELEASE  10/11/2012   Procedure: CARPAL TUNNEL RELEASE;  Surgeon: Dominica Severin, MD;  Location: [facility];  Service: Orthopedics;  Laterality: Right;   TUBAL LIGATION  01/27/2011   laparoscopic    Family Psychiatric History: None.  Family History: No family history on file.  Social History:  Social History   Socioeconomic History   Marital status: Single    Spouse name: Not on file   Number of children: Not on file   Years of education: Not on file   Highest education level: Not on file  Occupational History   Not on file  Tobacco Use   Smoking status: Never   Smokeless tobacco: Never  Substance and Sexual Activity   Alcohol use: No   Drug use: No   Sexual activity: Not Currently  Other Topics Concern   Not on file  Social History Narrative   Not on file   Social Determinants of Health   Financial Resource Strain: Not on file  Food Insecurity: Not on file  Transportation Needs: Not on file  Physical Activity: Not on file  Stress: Not on file  Social Connections:  Not on file    Allergies:  Allergies  Allergen Reactions   Aspirin Other (See Comments)    NOSEBLEEDS    Metabolic Disorder Labs: Lab Results  Component Value Date   HGBA1C 5.6 12/06/2018   MPG 114.02 12/06/2018   No results found for: PROLACTIN Lab Results  Component Value Date   CHOL 147 12/06/2018   TRIG 154 (H) 12/06/2018   HDL 44 12/06/2018   CHOLHDL 3.3 12/06/2018   VLDL 31 12/06/2018    LDLCALC 72 12/06/2018   No results found for: TSH  Therapeutic Level Labs: No results found for: LITHIUM No results found for: VALPROATE No components found for:  CBMZ  Current Medications: Current Outpatient Medications  Medication Sig Dispense Refill   amphetamine-dextroamphetamine (ADDERALL) 10 MG tablet Take 3 tablets (30 mg total) by mouth daily. Take two tablets in AM and 1 tablet at 2 PM 63 tablet 0   hydrocortisone 2.5 % cream      triamcinolone cream (KENALOG) 0.1 % triamcinolone acetonide 0.1 % topical cream     No current facility-administered medications for this visit.      Psychiatric Specialty Exam: Please note limitations in obtaining a full mental status exam in the context of phone based communication Review of Systems  All other systems reviewed and are negative.None endorsed  There were no vitals taken for this visit.There is no height or weight on file to calculate BMI.  General Appearance: NA  Eye Contact:  NA  Speech:  Normal Rate  Volume:  Normal  Mood: Stable, euthymic   Affect: Appropriate , reactive   Thought Process:  Goal Directed and Linear  Orientation:  Full (Time, Place, and Person)  Thought Content: No suicidal ideations, no homicidal ideations, no psychotic symptoms.   Suicidal Thoughts:  No denies SI or self-injurious ideations  Homicidal Thoughts:  No  Memory: Recent and remote grossly intact  Judgement:  present  Insight:  present  Psychomotor Activity:  NA  Concentration:  Concentration: Good  Recall:  Good  Fund of Knowledge: Good  Language: Good  Akathisia:  Negative  Handed:  Right  AIMS (if indicated): not done  Assets:  Communication Skills Desire for Improvement Financial Resources/Insurance Housing Social Support Talents/Skills  ADL's:  Intact  Cognition: WNL  Sleep:  Good   Screenings: AIMS    Flowsheet Row Admission (Discharged) [facility]   AIMS Total Score 0      AUDIT    Flowsheet Row Admission (Discharged) from [facility]  Alcohol Use Disorder Identification Test Final Score (AUDIT) 0      Flowsheet Row Admission (Discharged) from [facility]  C-SSRS RISK CATEGORY Low Risk        Assessment and Plan:   40 year old female.  History of depression, anxiety, narcolepsy by history.   History of a psychiatric admission in 2020 for worsening depression.   Reports currently doing well , stable . Tolerating medications well at this time  no side effects reported .  Continue Adderall 10 mgr BID Continue Trintellix 10 mgr QDAY . Reviewed side effects.    Next appointment in 4-6 weeks  She agrees to contact clinic sooner should there be any concern or worsening prior.      Craige Cotta, MD 08/23/2021, 8:15 AMPatient ID: Tara Vega, female   DOB: [birth year],  40 y.o.   MRN: [id number]

## 2021-09-03 ENCOUNTER — Other Ambulatory Visit (HOSPITAL_COMMUNITY): Payer: Self-pay | Admitting: Psychiatry

## 2021-09-07 ENCOUNTER — Other Ambulatory Visit (HOSPITAL_COMMUNITY): Payer: Self-pay | Admitting: Psychiatry

## 2021-09-07 MED ORDER — AMPHETAMINE-DEXTROAMPHETAMINE 10 MG PO TABS: 10.0000 mg | ORAL_TABLET | Freq: Two times a day (BID) | ORAL | 0 refills | Status: DC

## 2021-09-07 NOTE — Progress Notes (Signed)
Patient ID: Tara Vega, female   DOB: 05/23/1981, 40 y.o.   MRN: [id number] 09/07/2021  Received message from [place], Engineer, site. Patient reporting needs her Adderall script to go to pharmacy in [place] .  Adderall 10 mgr BID script re-routed to [facility] pharmacy as per her request    Sallyanne Havers MD

## 2021-10-04 ENCOUNTER — Telehealth (HOSPITAL_COMMUNITY): Payer: Self-pay | Admitting: Psychiatry

## 2021-10-04 ENCOUNTER — Other Ambulatory Visit: Payer: Self-pay

## 2021-10-04 ENCOUNTER — Encounter (HOSPITAL_COMMUNITY): Payer: Self-pay | Admitting: Psychiatry

## 2021-10-04 ENCOUNTER — Telehealth (HOSPITAL_BASED_OUTPATIENT_CLINIC_OR_DEPARTMENT_OTHER): Payer: BC Managed Care – PPO | Admitting: Psychiatry

## 2021-10-04 DIAGNOSIS — F321 Major depressive disorder, single episode, moderate: Secondary | ICD-10-CM

## 2021-10-04 MED ORDER — ALPRAZOLAM 0.25 MG PO TABS: 0.2500 mg | ORAL_TABLET | Freq: Every evening | ORAL | 0 refills | Status: DC | PRN

## 2021-10-04 MED ORDER — AMPHETAMINE-DEXTROAMPHETAMINE 10 MG PO TABS: 10.0000 mg | ORAL_TABLET | Freq: Two times a day (BID) | ORAL | 0 refills | Status: DC

## 2021-10-04 MED ORDER — VORTIOXETINE HBR 10 MG PO TABS: 15.0000 mg | ORAL_TABLET | Freq: Every day | ORAL | 1 refills | Status: DC

## 2021-10-04 NOTE — Telephone Encounter (Signed)
D:  Dr. Jama Flavors referred pt to [facility].  A:  Oriented pt.  Pt states she will call cm back, if she decides to do the program.  Mentioned to pt, to give cm a call back to discuss other options if she declines [facility]-IOP.  R:  Pt receptive.
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
## 2021-10-19 ENCOUNTER — Other Ambulatory Visit: Payer: Self-pay

## 2021-10-19 ENCOUNTER — Ambulatory Visit (HOSPITAL_BASED_OUTPATIENT_CLINIC_OR_DEPARTMENT_OTHER): Payer: BC Managed Care – PPO | Admitting: Psychiatry

## 2021-10-19 ENCOUNTER — Encounter (HOSPITAL_COMMUNITY): Payer: Self-pay | Admitting: Psychiatry

## 2021-10-19 DIAGNOSIS — F331 Major depressive disorder, recurrent, moderate: Secondary | ICD-10-CM | POA: Diagnosis not present

## 2021-10-19 MED ORDER — VORTIOXETINE HBR 10 MG PO TABS: 15.0000 mg | ORAL_TABLET | Freq: Every day | ORAL | 1 refills | Status: DC

## 2021-10-19 MED ORDER — ALPRAZOLAM 0.25 MG PO TABS: 0.2500 mg | ORAL_TABLET | Freq: Every evening | ORAL | 0 refills | Status: DC | PRN

## 2021-10-19 MED ORDER — AMPHETAMINE-DEXTROAMPHETAMINE 10 MG PO TABS: 10.0000 mg | ORAL_TABLET | Freq: Two times a day (BID) | ORAL | 0 refills | Status: DC
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
## 2021-11-15 ENCOUNTER — Encounter (HOSPITAL_COMMUNITY): Payer: Self-pay | Admitting: Psychiatry

## 2021-11-15 ENCOUNTER — Telehealth (HOSPITAL_COMMUNITY): Payer: BC Managed Care – PPO | Admitting: Psychiatry

## 2021-11-15 ENCOUNTER — Other Ambulatory Visit: Payer: Self-pay

## 2021-11-15 DIAGNOSIS — F324 Major depressive disorder, single episode, in partial remission: Secondary | ICD-10-CM

## 2021-11-15 MED ORDER — VORTIOXETINE HBR 10 MG PO TABS: 15.0000 mg | ORAL_TABLET | Freq: Every day | ORAL | 1 refills | Status: DC

## 2021-11-15 MED ORDER — AMPHETAMINE-DEXTROAMPHETAMINE 10 MG PO TABS: 10.0000 mg | ORAL_TABLET | Freq: Two times a day (BID) | ORAL | 0 refills | Status: DC

## 2021-11-15 NOTE — Progress Notes (Signed)
BH MD/PA/NP OP Progress Note  11/15/2021 2:15 PM Tara Vega  MRN:  [id number]  Attempted to contact [name] via phone for scheduled phone based medication management appointment . No answer. Left message requesting she call clinic to reschedule   Craige Cotta, MD 11/15/2021, 2:15 PM  Patient ID: Tara Vega, female   DOB: 02-28-1981, 41 y.o.   MRN: [id number]

## 2021-11-15 NOTE — Addendum Note (Signed)
Addended by: Nehemiah Massed A on: 11/15/2021 05:19 PM   Modules accepted: Orders
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
## 2021-11-29 DIAGNOSIS — L209 Atopic dermatitis, unspecified: Secondary | ICD-10-CM | POA: Diagnosis not present

## 2021-12-13 ENCOUNTER — Encounter (HOSPITAL_COMMUNITY): Payer: Self-pay | Admitting: Psychiatry

## 2021-12-13 ENCOUNTER — Other Ambulatory Visit: Payer: Self-pay

## 2021-12-13 ENCOUNTER — Telehealth (HOSPITAL_BASED_OUTPATIENT_CLINIC_OR_DEPARTMENT_OTHER): Payer: BC Managed Care – PPO | Admitting: Psychiatry

## 2021-12-13 DIAGNOSIS — F325 Major depressive disorder, single episode, in full remission: Secondary | ICD-10-CM

## 2021-12-13 MED ORDER — VORTIOXETINE HBR 10 MG PO TABS: 15.0000 mg | ORAL_TABLET | Freq: Every day | ORAL | 1 refills | Status: DC
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
## 2021-12-14 ENCOUNTER — Telehealth (HOSPITAL_COMMUNITY): Payer: Self-pay | Admitting: *Deleted

## 2021-12-14 NOTE — Telephone Encounter (Signed)
PA for TRINTELLIX 15 mg (10 mg, 1.5 tab) submitted via CoverMYMeds due to quantity limits. Awaiting determination.

## 2021-12-21 ENCOUNTER — Other Ambulatory Visit (HOSPITAL_COMMUNITY): Payer: Self-pay | Admitting: Psychiatry

## 2021-12-23 ENCOUNTER — Telehealth (HOSPITAL_COMMUNITY): Payer: Self-pay | Admitting: *Deleted

## 2021-12-23 MED ORDER — AMPHETAMINE-DEXTROAMPHETAMINE 10 MG PO TABS: 10.0000 mg | ORAL_TABLET | Freq: Two times a day (BID) | ORAL | 0 refills | Status: DC

## 2021-12-23 NOTE — Telephone Encounter (Signed)
Pt of Dr. Jama Flavors calling for Adderall 10 mg bid to be refilled to pharmacy on file (she only has one). Pt has an appointment upcoming on 02/21/22 with Dr. Jama Flavors. As Dr. Jama Flavors is out of the office today would you please refill. Thanks.

## 2021-12-23 NOTE — Addendum Note (Signed)
Addended by: Kathryne Sharper T on: 12/23/2021 02:59 PM   Modules accepted: Orders

## 2021-12-23 NOTE — Telephone Encounter (Signed)
Done

## 2022-01-03 DIAGNOSIS — M545 Low back pain, unspecified: Secondary | ICD-10-CM | POA: Diagnosis not present

## 2022-01-21 ENCOUNTER — Other Ambulatory Visit (HOSPITAL_COMMUNITY): Payer: Self-pay | Admitting: Psychiatry

## 2022-01-25 ENCOUNTER — Other Ambulatory Visit (HOSPITAL_BASED_OUTPATIENT_CLINIC_OR_DEPARTMENT_OTHER): Payer: BC Managed Care – PPO | Admitting: Psychiatry

## 2022-01-25 MED ORDER — AMPHETAMINE-DEXTROAMPHETAMINE 10 MG PO TABS: 10.0000 mg | ORAL_TABLET | Freq: Two times a day (BID) | ORAL | 0 refills | Status: DC

## 2022-01-25 NOTE — Progress Notes (Signed)
01/25/2022 ?Telephone documentation ? ?Spoke with patient via phone. ?Needs Adderall refill. ?Has upcoming appointment next month ? ?Have renewed Adderall 10 mg twice daily #60. ? ?F. Saagar Tortorella, MD ?

## 2022-02-18 ENCOUNTER — Other Ambulatory Visit (HOSPITAL_COMMUNITY): Payer: Self-pay | Admitting: Psychiatry

## 2022-02-21 ENCOUNTER — Telehealth (HOSPITAL_BASED_OUTPATIENT_CLINIC_OR_DEPARTMENT_OTHER): Payer: BC Managed Care – PPO | Admitting: Psychiatry

## 2022-02-21 ENCOUNTER — Telehealth (HOSPITAL_COMMUNITY): Payer: Self-pay

## 2022-02-21 ENCOUNTER — Encounter (HOSPITAL_COMMUNITY): Payer: Self-pay | Admitting: Psychiatry

## 2022-02-21 DIAGNOSIS — F325 Major depressive disorder, single episode, in full remission: Secondary | ICD-10-CM

## 2022-02-21 MED ORDER — VORTIOXETINE HBR 10 MG PO TABS: 15.0000 mg | ORAL_TABLET | Freq: Every day | ORAL | 1 refills | Status: DC

## 2022-02-21 MED ORDER — AMPHETAMINE-DEXTROAMPHETAMINE 10 MG PO TABS: 10.0000 mg | ORAL_TABLET | Freq: Two times a day (BID) | ORAL | 0 refills | Status: DC

## 2022-02-21 NOTE — Telephone Encounter (Signed)
BLUE CROSS BLUE SHIELD OF [place] PRESCRIPTION COVERAGE ?TRINTELLIX 10MG  TABLET  ?REFERENCE # [id number] ?EFFECTIVE 12/14/2021 TO 12/13/2022 ? ?NOTIFIED PHARMACY/PT ?
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
## 2022-03-21 ENCOUNTER — Encounter (HOSPITAL_COMMUNITY): Payer: Self-pay | Admitting: Psychiatry

## 2022-03-21 ENCOUNTER — Telehealth (HOSPITAL_BASED_OUTPATIENT_CLINIC_OR_DEPARTMENT_OTHER): Payer: BC Managed Care – PPO | Admitting: Psychiatry

## 2022-03-21 DIAGNOSIS — F325 Major depressive disorder, single episode, in full remission: Secondary | ICD-10-CM

## 2022-03-21 MED ORDER — AMPHETAMINE-DEXTROAMPHETAMINE 10 MG PO TABS: 10.0000 mg | ORAL_TABLET | Freq: Two times a day (BID) | ORAL | 0 refills | Status: DC

## 2022-03-21 MED ORDER — VORTIOXETINE HBR 10 MG PO TABS: 15.0000 mg | ORAL_TABLET | Freq: Every day | ORAL | 1 refills | Status: DC

## 2022-03-21 NOTE — Progress Notes (Signed)
BH MD/PA/NP OP Progress Note ? ?03/21/2022 8:38 AM ?Tara Vega  ?MRN:  [id number] ?Interview was conducted by phone and I verified that I was speaking with the correct person using two identifiers. I discussed the limitations of evaluation and management by telemedicine and  the availability of in person appointments. Patient expressed understanding and agreed to proceed. Participants in the visit: patient (location - home); physician (location -clinic office). ?Duration 20 minutes  ? ?Chief Complaint: Medication management appointment. ? ?Patient was being followed by Dr. Hinton DyerPucilowski, who has retired. I am providing outpatient psychiatric care pending new psychiatrist starting . ? ?HPI: ?  ?41 year old female.  History of depression, anxiety, narcolepsy by history.   History of a psychiatric admission in 2020 for worsening depression.  ? ? ?Reports she is doing well . Denies feeling depressed and presents euthymic. Does not endorse neuro-vegetative symptoms of depression. She describes she is functioning well in her every day activities including work and home life. ? ?She states she is doing well at work, works at [employer]. She has two minor children - reports they are about to complete school year soon. She has a 43 year old son from whom she is currently estranged . Attributes this to his wife , states that she discourages him from maintaining contact . ? ?With regards to narcolepsy history reports she has been doing well since starting stimulant medications and remains stable on current Adderall dose . Denies any episodes of falling asleep or of hypersomnia affecting her daily life . Denies cataplexy. ?She states she also was diagnosed with OSA in the past but that this improved with weight loss at the time .  ? ?Denies medication side effects and reports tolerating medications well . ? ?Checks her BP periodically and states has remained within normal . Denies palpitations .   ? ? ? ? ? ? ? ? ? ? ? ?Visit Diagnosis:  ?MDD by history ?Narcolepsy by history  ?Past Psychiatric History: Please see intake H&P. ? ?Past Medical History:  ?Past Medical History:  ?Diagnosis Date  ? Carpal tunnel syndrome, right 10/2012  ? Headache(784.0)   ? tension; migraines 1-2 x/month  ?  ?Past Surgical History:  ?Procedure Laterality Date  ? ANKLE FRACTURE SURGERY    ? APPENDECTOMY  02/26/2008  ? laparoscopic  ? CARPAL TUNNEL RELEASE  10/11/2012  ? Procedure: CARPAL TUNNEL RELEASE;  Surgeon: Dominica SeverinWilliam Gramig, MD;  [facility];  Service: Orthopedics;  Laterality: Right;  ? TUBAL LIGATION  01/27/2011  ? laparoscopic  ? ? ?Family Psychiatric History: None. ? ?Family History: No family history on file. ? ?Social History:  ?Social History  ? ?Socioeconomic History  ? Marital status: Single  ?  Spouse name: Not on file  ? Number of children: Not on file  ? Years of education: Not on file  ? Highest education level: Not on file  ?Occupational History  ? Not on file  ?Tobacco Use  ? Smoking status: Never  ? Smokeless tobacco: Never  ?Substance and Sexual Activity  ? Alcohol use: No  ? Drug use: No  ? Sexual activity: Not Currently  ?Other Topics Concern  ? Not on file  ?Social History Narrative  ? Not on file  ? ?Social Determinants of Health  ? ?Financial Resource Strain: Not on file  ?Food Insecurity: Not on file  ?Transportation Needs: Not on file  ?Physical Activity: Not on file  ?Stress: Not on file  ?Social Connections:  Not on file  ? ? ?Allergies:  ?Allergies  ?Allergen Reactions  ? Aspirin Other (See Comments)  ?  NOSEBLEEDS  ? ? ?Metabolic Disorder Labs: ?Lab Results  ?Component Value Date  ? HGBA1C 5.6 12/06/2018  ? MPG 114.02 12/06/2018  ? ?No results found for: PROLACTIN ?Lab Results  ?Component Value Date  ? CHOL 147 12/06/2018  ? TRIG 154 (H) 12/06/2018  ? HDL 44 12/06/2018  ? CHOLHDL 3.3 12/06/2018  ? VLDL 31 12/06/2018  ? LDLCALC 72 12/06/2018  ? ?No results found for:  TSH ? ?Therapeutic Level Labs: ?No results found for: LITHIUM ?No results found for: VALPROATE ?No components found for:  CBMZ ? ?Current Medications: ?Current Outpatient Medications  ?Medication Sig Dispense Refill  ? amphetamine-dextroamphetamine (ADDERALL) 10 MG tablet Take 1 tablet (10 mg total) by mouth 2 (two) times daily with a meal. 60 tablet 0  ? hydrocortisone 2.5 % cream     ? triamcinolone cream (KENALOG) 0.1 % triamcinolone acetonide 0.1 % topical cream    ? vortioxetine HBr (TRINTELLIX) 10 MG TABS tablet Take 1.5 tablets (15 mg total) by mouth daily. 45 tablet 1  ? ?No current facility-administered medications for this visit.  ? ? ? ? ?Psychiatric Specialty Exam: Please note limitations in obtaining a full mental status exam in the context of phone based communication ?ROS :  none endorsed   ?There were no vitals taken for this visit.There is no height or weight on file to calculate BMI.  ?General Appearance: NA  ?Eye Contact:  NA  ?Speech:  Normal Rate  ?Volume:  Normal  ?Mood: reports mood as stable /euthymic  ?Affect: full in range /appropriate , laughs appropriately at times   ?Thought Process:  Goal Directed and Linear  ?Orientation:  Full (Time, Place, and Person)  ?Thought Content: No SI, no HI, no psychotic symptoms, future oriented   ?Suicidal Thoughts:  No   ?Homicidal Thoughts:  No  ?Memory: Recent and remote grossly intact  ?Judgement:  present  ?Insight:  present  ?Psychomotor Activity:  NA  ?Concentration:  Concentration: Good  ?Recall:  Good  ?Fund of Knowledge: Good  ?Language: Good  ?Akathisia:  Negative  ?Handed:  Right  ?AIMS (if indicated): not done  ?Assets:  Communication Skills ?Desire for Improvement ?Financial Resources/Insurance ?Housing ?Social Support ?Talents/Skills  ?ADL's:  Intact  ?Cognition: WNL  ?Sleep:  Good  ? ?Screenings: ?AIMS   ? ?Flow[facility] [place] Admission (Discharged) [facility]  ?AIMS Total Score 0   ? ?  ? ?AUDIT   ? ?Flowsheet Row Admission (Discharged) [facility]  ?Alcohol Use Disorder Identification Test Final Score (AUDIT) 0  ? ?  ? ?Flowsheet Row Admission (Discharged) [facility]  ?C-SSRS RISK CATEGORY Low Risk  ? ?  ? ? ? ?Assessment and Plan:  ? ?41 year old female.  History of depression, anxiety, narcolepsy by history.   History of a psychiatric admission in 2020 for worsening depression.  ? ?Presents stable, functioning well in daily activities , euthymic ,  with a full range of affect . No significant neuro-vegetative symptoms endorsed . Hx of  Narcolepsy diagnosis , which she reports has been stable and well controlled since she was started on stimulant medication. Denies episodes of falling asleep or hypersomnia . Tolerating medications well , denies side effects.  ? ?Continue  Adderall 10 mgr BID for history of narcolepsy  ?Continue Trintellix 15 mgr QDAY  for depression. ?  ? ? ?Next appointment in about 8 weeks .  ?I have ensured patient has clinic phone number to call if needed and also crisis hotline number in case it is needed. ?She agrees to contact clinic sooner should there be any concern or worsening prior. ? ? ? ? ? ?Craige Cotta, MD ?03/21/2022, 8:38 AM ? ?Patient ID: Tara Vega, female   DOB: Mar 12, 1981, 41 y.o.   MRN: [id number] ? ? ? ?

## 2022-05-16 ENCOUNTER — Encounter (HOSPITAL_COMMUNITY): Payer: Self-pay | Admitting: Psychiatry

## 2022-05-16 ENCOUNTER — Telehealth (HOSPITAL_BASED_OUTPATIENT_CLINIC_OR_DEPARTMENT_OTHER): Payer: BC Managed Care – PPO | Admitting: Psychiatry

## 2022-05-16 DIAGNOSIS — F325 Major depressive disorder, single episode, in full remission: Secondary | ICD-10-CM | POA: Diagnosis not present

## 2022-05-16 MED ORDER — AMPHETAMINE-DEXTROAMPHETAMINE 10 MG PO TABS: 10.0000 mg | ORAL_TABLET | Freq: Two times a day (BID) | ORAL | 0 refills | Status: DC

## 2022-05-16 MED ORDER — VORTIOXETINE HBR 10 MG PO TABS: 15.0000 mg | ORAL_TABLET | Freq: Every day | ORAL | 1 refills | Status: DC

## 2022-05-16 NOTE — Progress Notes (Signed)
BH MD/PA/NP OP Progress Note  05/16/2022 8:46 AM Tara Vega  MRN:  [id number] Interview was conducted by phone and I verified that I was speaking with the correct person using two identifiers. I discussed the limitations of evaluation and management by telemedicine and  the availability of in person appointments. Patient expressed understanding and agreed to proceed. Participants in the visit: patient (location - home); physician (location -clinic office). Duration 15 minutes   Chief Complaint: Medication management appointment.  Patient was being followed by Dr. Hinton Dyer, who has retired. I am providing outpatient psychiatric care pending new psychiatrist starting .  HPI:   41 year old female.  History of depression, anxiety, narcolepsy by history.   History of a psychiatric admission in 2020 for worsening depression.   She reports she is doing well.  Describes mood as stable and denies feeling depressed. Presents euthymic, with a reactive/full range of affect . Does not endorse neuro-vegetative symptoms - sleep and appetite described as normal .  Reports she is functioning well in daily activities , including  at work and at home /with family .  She has not experienced increased somnolence or hypersomnia and describes narcolepsy as well controlled . She is tolerating medications well, denies side effects. She reports Trintellix has been working " well " for her and she has noticed improved/stable mood    Visit Diagnosis:  MDD by history Narcolepsy by history  Past Psychiatric History: Please see intake H&P.  Past Medical History:  Past Medical History:  Diagnosis Date   Carpal tunnel syndrome, right 10/2012   Headache(784.0)    tension; migraines 1-2 x/month    Past Surgical History:  Procedure Laterality Date   ANKLE FRACTURE SURGERY     APPENDECTOMY  02/26/2008   laparoscopic   CARPAL TUNNEL RELEASE  10/11/2012   Procedure: CARPAL TUNNEL RELEASE;  Surgeon: Dominica Severin, MD;  Location: [facility];  Service: Orthopedics;  Laterality: Right;   TUBAL LIGATION  01/27/2011   laparoscopic    Family Psychiatric History: None.  Family History: No family history on file.  Social History:  Social History   Socioeconomic History   Marital status: Single    Spouse name: Not on file   Number of children: Not on file   Years of education: Not on file   Highest education level: Not on file  Occupational History   Not on file  Tobacco Use   Smoking status: Never   Smokeless tobacco: Never  Substance and Sexual Activity   Alcohol use: No   Drug use: No   Sexual activity: Not Currently  Other Topics Concern   Not on file  Social History Narrative   Not on file   Social Determinants of Health   Financial Resource Strain: Not on file  Food Insecurity: Not on file  Transportation Needs: Not on file  Physical Activity: Not on file  Stress: Not on file  Social Connections: Not on file    Allergies:  Allergies  Allergen Reactions   Aspirin Other (See Comments)    NOSEBLEEDS    Metabolic Disorder Labs: Lab Results  Component Value Date   HGBA1C 5.6 12/06/2018   MPG 114.02 12/06/2018   No results found for: "PROLACTIN" Lab Results  Component Value Date   CHOL 147 12/06/2018   TRIG 154 (H) 12/06/2018   HDL 44 12/06/2018   CHOLHDL 3.3 12/06/2018   VLDL 31 12/06/2018   LDLCALC 72 12/06/2018   No results found for: "TSH"  Therapeutic Level Labs: No results found for: "LITHIUM" No results found for: "VALPROATE" No results found for: "CBMZ"  Current Medications: Current Outpatient Medications  Medication Sig Dispense Refill   amphetamine-dextroamphetamine (ADDERALL) 10 MG tablet Take 1 tablet (10 mg total) by mouth 2 (two) times daily with a meal. 60 tablet 0   amphetamine-dextroamphetamine (ADDERALL) 10 MG tablet Take 1 tablet (10 mg total) by mouth 2 (two) times daily with a meal. Do Not Refill before 04/22/2022 60  tablet 0   hydrocortisone 2.5 % cream      triamcinolone cream (KENALOG) 0.1 % triamcinolone acetonide 0.1 % topical cream     vortioxetine HBr (TRINTELLIX) 10 MG TABS tablet Take 1.5 tablets (15 mg total) by mouth daily. 45 tablet 1   No current facility-administered medications for this visit.      Psychiatric Specialty Exam: Please note limitations in obtaining a full mental status exam in the context of phone based communication ROS :  none endorsed   There were no vitals taken for this visit.There is no height or weight on file to calculate BMI.  General Appearance: NA  Eye Contact:  NA  Speech:  Normal Rate  Volume:  Normal  Mood: reports mood as stable /euthymic  Affect: full in range /appropriate , laughs appropriately at times   Thought Process:  Goal Directed and Linear  Orientation:  Full (Time, Place, and Person)  Thought Content: No SI, no HI, no psychotic symptoms, future oriented   Suicidal Thoughts:  No   Homicidal Thoughts:  No  Memory: Recent and remote grossly intact  Judgement:  present  Insight:  present  Psychomotor Activity:  NA  Concentration:  Concentration: Good  Recall:  Good  Fund of Knowledge: Good  Language: Good  Akathisia:  Negative  Handed:  Right  AIMS (if indicated): not done  Assets:  Communication Skills Desire for Improvement Financial Resources/Insurance Housing Social Support Talents/Skills  ADL's:  Intact  Cognition: WNL  Sleep:  Good   Screenings: AIMS    Flowsheet Row Admission (Discharged) [facility]  AIMS Total Score 0      AUDIT    Flowsheet Row Admission (Discharged) from [facility]  Alcohol Use Disorder Identification Test Final Score (AUDIT) 0      Flowsheet Row Admission (Discharged) from [facility]  C-SSRS RISK  CATEGORY Low Risk        Assessment and Plan:   41 year old female.  History of depression, anxiety, narcolepsy by history.   History of a psychiatric admission in 2020 for worsening depression.   She reports she is doing well , functioning well in daily activities . She describes mood as stable and presents euthymic with a full range of affect . She denies hypersomnia/somnolence, and reports current Adderall management /dosing as effective . No medication side effects .   Continue Adderall 10 mgr BID for history of narcolepsy  Continue Trintellix 15 mgr QDAY  for depression.     Next appointment in about 8 weeks .   She agrees to contact clinic sooner should there be any concern or worsening prior.      Craige Cotta, MD 05/16/2022, 8:46 AM  Patient ID: Tara Vega, female   DOB: [birth year], 41 y.o.   MRN: [id number]

## 2022-06-27 ENCOUNTER — Encounter (HOSPITAL_COMMUNITY): Payer: Self-pay | Admitting: Psychiatry

## 2022-06-27 ENCOUNTER — Telehealth (HOSPITAL_BASED_OUTPATIENT_CLINIC_OR_DEPARTMENT_OTHER): Payer: BC Managed Care – PPO | Admitting: Psychiatry

## 2022-06-27 DIAGNOSIS — F3342 Major depressive disorder, recurrent, in full remission: Secondary | ICD-10-CM

## 2022-06-27 MED ORDER — AMPHETAMINE-DEXTROAMPHETAMINE 10 MG PO TABS: 10.0000 mg | ORAL_TABLET | Freq: Two times a day (BID) | ORAL | 0 refills | Status: DC

## 2022-06-27 MED ORDER — VORTIOXETINE HBR 10 MG PO TABS: 15.0000 mg | ORAL_TABLET | Freq: Every day | ORAL | 1 refills | Status: DC

## 2022-06-27 NOTE — Progress Notes (Signed)
BH MD/PA/NP OP Progress Note  06/27/2022 8:11 AM JACK MINEAU  MRN:  [id number] Interview was conducted by phone and I verified that I was speaking with the correct person using two identifiers. I discussed the limitations of evaluation and management by telemedicine and  the availability of in person appointments. Patient expressed understanding and agreed to proceed. Participants in the visit: patient (location - home); physician (location -clinic office). Duration 15 minutes   Chief Complaint: Medication management appointment.  Patient was being followed by Dr. Hinton Dyer, who has retired. I am providing outpatient psychiatric care pending new psychiatrist starting .  HPI:   41 year old female.  History of depression, anxiety, narcolepsy by history.   History of a psychiatric admission in 2020 for worsening depression.   Ms. Schuenemann reports that she has been stable/doing well. (Today she reports she has some stress and that her pet cat is missing.) Describes her mood as stable.  Denies feeling depressed or sad at this time.  Does not endorse neurovegetative symptoms.  Denies any suicidal ideations. She has a history of narcolepsy.  She has had no excessive drowsiness or sedation and has been able to function well in her daily activities, including work/home activities. No history of cataplexy reported. She reports she is tolerating medications well.  She is on Trintellix and Adderall.  Denies side effects, which we reviewed.  She takes her blood pressure regularly at work and states that it has been within normal. She endorses a past history of OSA but reports that this improved significantly after she lost weight and no longer uses CPAP.  She is sleeping well.     Visit Diagnosis:  MDD by history Narcolepsy by history  Past Psychiatric History: Please see intake H&P.  Past Medical History:  Past Medical History:  Diagnosis Date   Carpal tunnel syndrome, right 10/2012    Headache(784.0)    tension; migraines 1-2 x/month    Past Surgical History:  Procedure Laterality Date   ANKLE FRACTURE SURGERY     APPENDECTOMY  02/26/2008   laparoscopic   CARPAL TUNNEL RELEASE  10/11/2012   Procedure: CARPAL TUNNEL RELEASE;  Surgeon: Dominica Severin, MD;  Location: [facility];  Service: Orthopedics;  Laterality: Right;   TUBAL LIGATION  01/27/2011   laparoscopic    Family Psychiatric History: None.  Family History: No family history on file.  Social History:  Social History   Socioeconomic History   Marital status: Single    Spouse name: Not on file   Number of children: Not on file   Years of education: Not on file   Highest education level: Not on file  Occupational History   Not on file  Tobacco Use   Smoking status: Never   Smokeless tobacco: Never  Substance and Sexual Activity   Alcohol use: No   Drug use: No   Sexual activity: Not Currently  Other Topics Concern   Not on file  Social History Narrative   Not on file   Social Determinants of Health   Financial Resource Strain: Not on file  Food Insecurity: Not on file  Transportation Needs: Not on file  Physical Activity: Not on file  Stress: Not on file  Social Connections: Not on file    Allergies:  Allergies  Allergen Reactions   Aspirin Other (See Comments)    NOSEBLEEDS    Metabolic Disorder Labs: Lab Results  Component Value Date   HGBA1C 5.6 12/06/2018   MPG 114.02 12/06/2018  No results found for: "PROLACTIN" Lab Results  Component Value Date   CHOL 147 12/06/2018   TRIG 154 (H) 12/06/2018   HDL 44 12/06/2018   CHOLHDL 3.3 12/06/2018   VLDL 31 12/06/2018   LDLCALC 72 12/06/2018   No results found for: "TSH"  Therapeutic Level Labs: No results found for: "LITHIUM" No results found for: "VALPROATE" No results found for: "CBMZ"  Current Medications: Current Outpatient Medications  Medication Sig Dispense Refill   amphetamine-dextroamphetamine  (ADDERALL) 10 MG tablet Take 1 tablet (10 mg total) by mouth 2 (two) times daily with a meal. 60 tablet 0   amphetamine-dextroamphetamine (ADDERALL) 10 MG tablet Take 1 tablet (10 mg total) by mouth 2 (two) times daily with a meal. 60 tablet 0   hydrocortisone 2.5 % cream      triamcinolone cream (KENALOG) 0.1 % triamcinolone acetonide 0.1 % topical cream     vortioxetine HBr (TRINTELLIX) 10 MG TABS tablet Take 1.5 tablets (15 mg total) by mouth daily. 45 tablet 1   No current facility-administered medications for this visit.      Psychiatric Specialty Exam: Please note limitations in obtaining a full mental status exam in the context of phone based communication ROS :  none endorsed   There were no vitals taken for this visit.There is no height or weight on file to calculate BMI.  General Appearance: NA  Eye Contact:  NA  Speech:  Normal Rate  Volume:  Normal  Mood: Describes mood as stable and presents euthymic.  Denies depression at this time.  Affect: full in range /appropriate   Thought Process:  Goal Directed and Linear  Orientation:  Full (Time, Place, and Person)  Thought Content: Denies any self-injurious or suicidal thoughts.  No HI.  Denies hallucinations.  No delusions are expressed.  Suicidal Thoughts:  No   Homicidal Thoughts:  No  Memory: Recent and remote grossly intact  Judgement:  present  Insight:  present  Psychomotor Activity:  NA  Concentration:  Concentration: Good  Recall:  Good  Fund of Knowledge: Good  Language: Good  Akathisia:  Negative  Handed:  Right  AIMS (if indicated): not done  Assets:  Communication Skills Desire for Improvement Financial Resources/Insurance Housing Social Support Talents/Skills  ADL's:  Intact  Cognition: WNL  Sleep: Reports she is sleeping well/stable.   Screenings: AIMS    Flowsheet Row Admission (Discharged) [facility]  AIMS Total Score 0       AUDIT    Flowsheet Row Admission (Discharged) from [facility]  Alcohol Use Disorder Identification Test Final Score (AUDIT) 0      Flowsheet Row Admission (Discharged) from [facility]  C-SSRS RISK CATEGORY Low Risk        Assessment and Plan:   41 year old female.  History of depression, anxiety, narcolepsy by history.   History of a psychiatric admission in 2020 for worsening depression.   Currently Ms.Heater reports she is doing well . Mood stable/euthymic.  Does not endorse significant neurovegetative symptoms.  Functioning well in her daily activities.  History of narcolepsy, currently well controlled. Tolerating current medication regimen well without side effects.   Continue Adderall 10 mgr BID for history of narcolepsy  Continue Trintellix 15 mgr QDAY  for depression.   Next appointment in about 8 weeks .   She agrees to  contact clinic sooner should there be any concern or worsening prior.      Craige Cotta, MD 06/27/2022, 8:11 AM  Patient ID: Shana Chute, female   DOB: 04/03/1981, 41 y.o.   MRN: [id number]

## 2022-07-14 DIAGNOSIS — Z1231 Encounter for screening mammogram for malignant neoplasm of breast: Secondary | ICD-10-CM | POA: Diagnosis not present

## 2022-07-25 ENCOUNTER — Telehealth (HOSPITAL_BASED_OUTPATIENT_CLINIC_OR_DEPARTMENT_OTHER): Payer: BC Managed Care – PPO | Admitting: Psychiatry

## 2022-07-25 ENCOUNTER — Encounter (HOSPITAL_COMMUNITY): Payer: Self-pay | Admitting: Psychiatry

## 2022-07-25 DIAGNOSIS — G47419 Narcolepsy without cataplexy: Secondary | ICD-10-CM

## 2022-07-25 DIAGNOSIS — F3342 Major depressive disorder, recurrent, in full remission: Secondary | ICD-10-CM | POA: Diagnosis not present

## 2022-07-25 MED ORDER — VORTIOXETINE HBR 10 MG PO TABS: 15.0000 mg | ORAL_TABLET | Freq: Every day | ORAL | 1 refills | Status: DC

## 2022-07-25 MED ORDER — AMPHETAMINE-DEXTROAMPHETAMINE 10 MG PO TABS: 10.0000 mg | ORAL_TABLET | Freq: Two times a day (BID) | ORAL | 0 refills | Status: DC

## 2022-07-25 NOTE — Progress Notes (Signed)
BH MD/PA/NP OP Progress Note  07/25/2022 2:08 PM Tara Vega  MRN:  [id number] Interview was conducted by phone and I verified that I was speaking with the correct person using two identifiers. I discussed the limitations of evaluation and management by telemedicine and  the availability of in person appointments. Patient expressed understanding and agreed to proceed. Participants in the visit: patient (location - home); physician (location -clinic office). Duration 15 minutes   Chief Complaint: Medication management appointment.  Patient was being followed by Dr. Hinton Dyer, who has retired. I am providing outpatient psychiatric care pending new psychiatrist starting .  HPI:   41 year old female.  History of depression, anxiety, narcolepsy by history.   History of a psychiatric admission in 2020 for worsening depression.   Tara Vega reports that she is doing well/currently stable. She describes her mood has been "all right", denies depression or neurovegetative symptoms.  Reports she is functioning well in her daily activities to include family/home life and work.   She has a history of narcolepsy diagnosis.  She reports that symptoms have been well controlled on Adderall and denies any drowsiness or daytime sleepiness.  He is able to function well in her daily activities without feeling drowsy or sedated.  Denies any cataplexy.  She also reports mood has noticeably improved over time, which she attributes to Trintellix, which she states has been well-tolerated and effective for her. At this time does not endorse significant neurovegetative symptoms and presents euthymic.     Visit Diagnosis:  MDD by history Narcolepsy by history  Past Psychiatric History: Please see intake H&P.  Past Medical History:  Past Medical History:  Diagnosis Date   Carpal tunnel syndrome, right 10/2012   Headache(784.0)    tension; migraines 1-2 x/month    Past Surgical History:  Procedure  Laterality Date   ANKLE FRACTURE SURGERY     APPENDECTOMY  02/26/2008   laparoscopic   CARPAL TUNNEL RELEASE  10/11/2012   Procedure: CARPAL TUNNEL RELEASE;  Surgeon: Dominica Severin, MD;  Location: [facility];  Service: Orthopedics;  Laterality: Right;   TUBAL LIGATION  01/27/2011   laparoscopic    Family Psychiatric History: None.  Family History: No family history on file.  Social History:  Social History   Socioeconomic History   Marital status: Single    Spouse name: Not on file   Number of children: Not on file   Years of education: Not on file   Highest education level: Not on file  Occupational History   Not on file  Tobacco Use   Smoking status: Never   Smokeless tobacco: Never  Substance and Sexual Activity   Alcohol use: No   Drug use: No   Sexual activity: Not Currently  Other Topics Concern   Not on file  Social History Narrative   Not on file   Social Determinants of Health   Financial Resource Strain: Not on file  Food Insecurity: Not on file  Transportation Needs: Not on file  Physical Activity: Not on file  Stress: Not on file  Social Connections: Not on file    Allergies:  Allergies  Allergen Reactions   Aspirin Other (See Comments)    NOSEBLEEDS    Metabolic Disorder Labs: Lab Results  Component Value Date   HGBA1C 5.6 12/06/2018   MPG 114.02 12/06/2018   No results found for: "PROLACTIN" Lab Results  Component Value Date   CHOL 147 12/06/2018   TRIG 154 (H) 12/06/2018  HDL 44 12/06/2018   CHOLHDL 3.3 12/06/2018   VLDL 31 12/06/2018   LDLCALC 72 12/06/2018   No results found for: "TSH"  Therapeutic Level Labs: No results found for: "LITHIUM" No results found for: "VALPROATE" No results found for: "CBMZ"  Current Medications: Current Outpatient Medications  Medication Sig Dispense Refill   amphetamine-dextroamphetamine (ADDERALL) 10 MG tablet Take 1 tablet (10 mg total) by mouth 2 (two) times daily with a  meal. 60 tablet 0   amphetamine-dextroamphetamine (ADDERALL) 10 MG tablet Take 1 tablet (10 mg total) by mouth 2 (two) times daily with a meal. 60 tablet 0   hydrocortisone 2.5 % cream      triamcinolone cream (KENALOG) 0.1 % triamcinolone acetonide 0.1 % topical cream     vortioxetine HBr (TRINTELLIX) 10 MG TABS tablet Take 1.5 tablets (15 mg total) by mouth daily. 45 tablet 1   No current facility-administered medications for this visit.      Psychiatric Specialty Exam: Please note limitations in obtaining a full mental status exam in the context of phone based communication ROS :  none endorsed   There were no vitals taken for this visit.There is no height or weight on file to calculate BMI.  General Appearance: NA  Eye Contact:  NA  Speech:  Normal Rate  Volume:  Normal  Mood: Reports her mood is "all right".  Denies feeling depressed or sad.  Presents euthymic.  Affect: full in range /appropriate   Thought Process:  Goal Directed and Linear  Orientation:  Full (Time, Place, and Person)  Thought Content: Denies any self-injurious or suicidal thoughts.  No HI.  Denies hallucinations.  No delusions are expressed.  Suicidal Thoughts:  No   Homicidal Thoughts:  No  Memory: Recent and remote grossly intact  Judgement:  present  Insight:  present  Psychomotor Activity:  NA  Concentration:  Concentration: Good  Recall:  Good  Fund of Knowledge: Good  Language: Good  Akathisia:  Negative  Handed:  Right  AIMS (if indicated): not done  Assets:  Communication Skills Desire for Improvement Financial Resources/Insurance Housing Social Support Talents/Skills  ADL's:  Intact  Cognition: WNL  Sleep: Reports she is sleeping well/stable.   Screenings: AIMS    Flowsheet Row Admission (Discharged) from OP Visit from 12/06/2018 in [facility]  AIMS Total Score 0      AUDIT    Flowsheet Row Admission (Discharged) from OP Visit from 12/06/2018 in  [facility]  Alcohol Use Disorder Identification Test Final Score (AUDIT) 0      Flowsheet Row Admission (Discharged) from OP Visit from 12/06/2018 in [facility]  C-SSRS RISK CATEGORY Low Risk        Assessment and Plan:   41 year old female.  History of depression, anxiety, narcolepsy by history.   History of a psychiatric admission in 2020 for worsening depression.   Currently Ms Etzler reports she is doing well .  Reports narcolepsy has been well controlled and is currently asymptomatic.  She denies any drowsiness or daytime sleepiness and is able to function well in her daily activities, including work and home related activities.  She has a history of depression and some anxiety but states that mood has improved significantly on Trintellix which she feels has been effective and well-tolerated for her.  At this time euthymic with full range of affect, no anhedonia. Tolerating medications well.   Continue Adderall 10 mgr BID  for history of narcolepsy  Continue Trintellix 15 mgr QDAY  for depression.   Next appointment in about 8 weeks .   She agrees to contact clinic sooner should there be any concern or worsening prior.      Craige Cotta, MD 07/25/2022, 2:08 PM  Patient ID: Shana Chute, female   DOB: [birth year], 41 y.o.   MRN: [id number]

## 2022-09-05 ENCOUNTER — Encounter (HOSPITAL_COMMUNITY): Payer: Self-pay | Admitting: Psychiatry

## 2022-09-05 ENCOUNTER — Telehealth (HOSPITAL_BASED_OUTPATIENT_CLINIC_OR_DEPARTMENT_OTHER): Payer: BC Managed Care – PPO | Admitting: Psychiatry

## 2022-09-05 DIAGNOSIS — F325 Major depressive disorder, single episode, in full remission: Secondary | ICD-10-CM

## 2022-09-05 DIAGNOSIS — G47419 Narcolepsy without cataplexy: Secondary | ICD-10-CM | POA: Diagnosis not present

## 2022-09-05 MED ORDER — VORTIOXETINE HBR 10 MG PO TABS: 15.0000 mg | ORAL_TABLET | Freq: Every day | ORAL | 1 refills | Status: DC

## 2022-09-05 MED ORDER — AMPHETAMINE-DEXTROAMPHETAMINE 10 MG PO TABS: 10.0000 mg | ORAL_TABLET | Freq: Two times a day (BID) | ORAL | 0 refills | Status: DC

## 2022-09-05 NOTE — Progress Notes (Signed)
[place] MD/PA/NP OP Progress Note  09/05/2022 9:07 AM BOBBETTE EAKES  MRN:  [id number] Interview was conducted by phone and I verified that I was speaking with the correct person using two identifiers. I discussed the limitations of evaluation and management by telemedicine and  the availability of in person appointments. Patient expressed understanding and agreed to proceed. Participants in the visit: patient (location - home); physician (location -clinic office). Duration 15 minutes   Chief Complaint: Medication management appointment.  Patient was being followed by Dr. Montel Culver, who has retired. I am providing outpatient psychiatric care pending new psychiatrist starting .  HPI:   41 year old female.  History of depression, anxiety, narcolepsy by history.   History of a psychiatric admission in 2020 for worsening depression.   Ms. Krehbiel reports that she is doing well/currently stable. With regards to history of narcolepsy, reports symptoms are well controlled on current medication. ( On Adderall , which she has taken for several years ) .  She denies daytime drowsiness, she denies falling asleep during the daytime, she denies having any difficulties staying alert at work or in other daily activities.  She does not endorse history of cataplexy.  She is sleeping well at night. With regards to her history of depression and anxiety ( on Trintellix) . Reports medication has been very effective and describes improved and currently stable mood. At this time she presents euthymic, with a full range of affect.  She does not endorse neurovegetative symptoms.  Of note, patient reports history of Raynaud's symptoms / had been prescribed Amlodipine by her PCP to address  She reports this issue currently well controlled/symptoms resolved .   We reviewed medication side effects, including that  Adderall may rarely be related to Raynaud's phenomenon .   Reviewed other possible stimulant side effects  including cardiovascular side effects, anorexia, and psychosis . She denies having any side effects. (She monitors her BP regularly , reports most recent was 117/79. )      Visit Diagnosis:  MDD by history Narcolepsy by history  Past Psychiatric History: Please see intake H&P.  Past Medical History:  Past Medical History:  Diagnosis Date   Carpal tunnel syndrome, right 10/2012   Headache(784.0)    tension; migraines 1-2 x/month    Past Surgical History:  Procedure Laterality Date   ANKLE FRACTURE SURGERY     APPENDECTOMY  02/26/2008   laparoscopic   CARPAL TUNNEL RELEASE  10/11/2012   Procedure: CARPAL TUNNEL RELEASE;  Surgeon: Roseanne Kaufman, MD;  Location: [facility];  Service: Orthopedics;  Laterality: Right;   TUBAL LIGATION  01/27/2011   laparoscopic    Family Psychiatric History: None.  Family History: No family history on file.  Social History:  Social History   Socioeconomic History   Marital status: Single    Spouse name: Not on file   Number of children: Not on file   Years of education: Not on file   Highest education level: Not on file  Occupational History   Not on file  Tobacco Use   Smoking status: Never   Smokeless tobacco: Never  Substance and Sexual Activity   Alcohol use: No   Drug use: No   Sexual activity: Not Currently  Other Topics Concern   Not on file  Social History Narrative   Not on file   Social Determinants of Health   Financial Resource Strain: Not on file  Food Insecurity: Not on file  Transportation Needs: Not on file  Physical Activity: Not on file  Stress: Not on file  Social Connections: Not on file    Allergies:  Allergies  Allergen Reactions   Aspirin Other (See Comments)    NOSEBLEEDS    Metabolic Disorder Labs: Lab Results  Component Value Date   HGBA1C 5.6 12/06/2018   MPG 114.02 12/06/2018   No results found for: "PROLACTIN" Lab Results  Component Value Date   CHOL 147 12/06/2018    TRIG 154 (H) 12/06/2018   HDL 44 12/06/2018   CHOLHDL 3.3 12/06/2018   VLDL 31 12/06/2018   LDLCALC 72 12/06/2018   No results found for: "TSH"  Therapeutic Level Labs: No results found for: "LITHIUM" No results found for: "VALPROATE" No results found for: "CBMZ"  Current Medications: Current Outpatient Medications  Medication Sig Dispense Refill   amphetamine-dextroamphetamine (ADDERALL) 10 MG tablet Take 1 tablet (10 mg total) by mouth 2 (two) times daily with a meal. 60 tablet 0   amphetamine-dextroamphetamine (ADDERALL) 10 MG tablet Take 1 tablet (10 mg total) by mouth 2 (two) times daily with a meal. 60 tablet 0   hydrocortisone 2.5 % cream      triamcinolone cream (KENALOG) 0.1 % triamcinolone acetonide 0.1 % topical cream     vortioxetine HBr (TRINTELLIX) 10 MG TABS tablet Take 1.5 tablets (15 mg total) by mouth daily. 45 tablet 1   No current facility-administered medications for this visit.      Psychiatric Specialty Exam: Please note limitations in obtaining a full mental status exam in the context of phone based communication ROS :  none endorsed   There were no vitals taken for this visit.There is no height or weight on file to calculate BMI.  General Appearance: NA  Eye Contact:  NA  Speech:  Normal Rate  Volume:  Normal  Mood: Denies depression and presents euthymic   Affect: full in range /appropriate   Thought Process:  Goal Directed and Linear  Orientation:  Full (Time, Place, and Person)  Thought Content: Denies any self-injurious or suicidal thoughts.  No HI.  Denies hallucinations.  No delusions are expressed.  Suicidal Thoughts:  No   Homicidal Thoughts:  No  Memory: Recent and remote grossly intact  Judgement:  present  Insight:  present  Psychomotor Activity:  NA  Concentration:  Concentration: Good  Recall:  Good  Fund of Knowledge: Good  Language: Good  Akathisia:  Negative  Handed:  Right  AIMS (if indicated): not done  Assets:   Communication Skills Desire for Improvement Financial Resources/Insurance Housing Social Support Talents/Skills  ADL's:  Intact  Cognition: WNL  Sleep: Reports she is sleeping well/stable.   Screenings: AIMS    Flowsheet Row Admission (Discharged) [facility]  AIMS Total Score 0      AUDIT    Flowsheet Row Admission (Discharged) from [facility]  Alcohol Use Disorder Identification Test Final Score (AUDIT) 0      Flowsheet Row Admission (Discharged) from [facility]  C-SSRS RISK CATEGORY Low Risk        Assessment and Plan:   41 year old female.  History of depression, anxiety, narcolepsy by history.   History of a psychiatric admission in 2020 for worsening depression.    Currently Ms. Faro continues to do well.  Functioning well in daily activities.  She denies feeling depressed, presents euthymic.  She has a history of narcolepsy which is currently well controlled, and denies any excessive drowsiness or sleepiness or having any episodes of falling asleep during daytime activities.  She reports current medication regimen has been effective and well-tolerated.     Continue Adderall 10 mgr BID for history of narcolepsy  Continue Trintellix 15 mgr QDAY  for depression.   Next appointment in about 8 weeks .   She agrees to contact clinic sooner should there be any concern or worsening prior.      Craige Cotta, MD 09/05/2022, 9:07 AM  Patient ID: Shana Chute, female   DOB: 04-05-1981, 41 y.o.   MRN: [id number]

## 2022-10-13 ENCOUNTER — Other Ambulatory Visit (HOSPITAL_COMMUNITY): Payer: Self-pay | Admitting: Psychiatry

## 2022-10-18 ENCOUNTER — Other Ambulatory Visit (HOSPITAL_COMMUNITY): Payer: Self-pay | Admitting: Psychiatry

## 2022-10-18 ENCOUNTER — Encounter (HOSPITAL_COMMUNITY): Payer: Self-pay | Admitting: Psychiatry

## 2022-10-18 MED ORDER — AMPHETAMINE-DEXTROAMPHETAMINE 10 MG PO TABS: 10.0000 mg | ORAL_TABLET | Freq: Two times a day (BID) | ORAL | 0 refills | Status: DC

## 2022-10-18 NOTE — Progress Notes (Signed)
Patient ID: Tara Vega, female   DOB: 16-Oct-1981, 41 y.o.   MRN: [id number] 10/18/2022 Patient requested refill for Adderall 10 mgr BID. Spoke with patient on phone, reports she is currently doing well . Scheduled appointment for 11/14/22. Have renewed Adderall 10 mg BID #60, NR   F Nyaira Hodgens , MD

## 2022-10-24 ENCOUNTER — Encounter: Payer: Self-pay | Admitting: Gastroenterology

## 2022-11-14 ENCOUNTER — Telehealth (HOSPITAL_BASED_OUTPATIENT_CLINIC_OR_DEPARTMENT_OTHER): Payer: BC Managed Care – PPO | Admitting: Psychiatry

## 2022-11-14 ENCOUNTER — Encounter (HOSPITAL_COMMUNITY): Payer: Self-pay | Admitting: Psychiatry

## 2022-11-14 DIAGNOSIS — G47419 Narcolepsy without cataplexy: Secondary | ICD-10-CM

## 2022-11-14 MED ORDER — AMPHETAMINE-DEXTROAMPHETAMINE 10 MG PO TABS: 10.0000 mg | ORAL_TABLET | Freq: Two times a day (BID) | ORAL | 0 refills | Status: DC

## 2022-11-14 MED ORDER — VORTIOXETINE HBR 10 MG PO TABS: 15.0000 mg | ORAL_TABLET | Freq: Every day | ORAL | 1 refills | Status: DC

## 2022-11-14 NOTE — Progress Notes (Signed)
[place] MD/PA/NP OP Progress Note  11/14/2022 9:13 AM Tara Vega  MRN:  [id number] Interview was conducted by phone and I verified that I was speaking with the correct person using two identifiers. I discussed the limitations of evaluation and management by telemedicine and  the availability of in person appointments. Patient expressed understanding and agreed to proceed. Participants in the visit: patient (location - home); physician (location -clinic office). Duration 15 minutes   Chief Complaint: Medication management appointment.  Patient was being followed by Dr. Montel Culver, who has retired. I am providing outpatient psychiatric care pending new psychiatrist starting .  HPI:   42 year old female.  History of depression, anxiety, narcolepsy by history.   History of a psychiatric admission in 2020 for worsening depression.   Reports she is doing well.  Functioning well in daily activities, including work and home life. She describes her mood as stable and presents euthymic with a full range of affect.  Does not endorse anhedonia or significant neurovegetative symptoms.  With regards to history of narcolepsy, reports she has had no issues, denies any daytime drowsiness, denies any episodes of falling asleep during the day, is able to function well in her daily activities. She reports current medications are well-tolerated and effective.  She states that Trintellix has been very helpful .  With regards to Adderall she states current dose (10 mg twice daily) appears to be optimal dose as symptoms well-controlled but able to sleep well at night and without any side effects.  She has been facing some stressors regarding her 2 children's school (she has a 35 year old son and a 84 year old daughter).  Reports that she has been concerned that the school they are now going to is not up to par academically and that they are falling behind insofar as academics.  Because of this she has been thinking of  switching him to another school, but states this prospect was initially anxiety provoking "because I did not know where to start".  At this point she has made some phone calls to school officials and is getting information regarding what other schools/resources may be available.  Provided support, reviewed how she has been coping with above stressor effectively.  I have informed patient that I am leaving clinic in March 2024, after which she may be transitioned to another [facility] provider for ongoing management .  Expresses understanding.      Visit Diagnosis:  MDD by history Narcolepsy by history  Past Psychiatric History: Please see intake H&P.  Past Medical History:  Past Medical History:  Diagnosis Date   Carpal tunnel syndrome, right 10/2012   Headache(784.0)    tension; migraines 1-2 x/month    Past Surgical History:  Procedure Laterality Date   ANKLE FRACTURE SURGERY     APPENDECTOMY  02/26/2008   laparoscopic   CARPAL TUNNEL RELEASE  10/11/2012   Procedure: CARPAL TUNNEL RELEASE;  Surgeon: Roseanne Kaufman, MD;  Location: [place];  Service: Orthopedics;  Laterality: Right;   TUBAL LIGATION  01/27/2011   laparoscopic    Family Psychiatric History: None.  Family History: No family history on file.  Social History:  Social History   Socioeconomic History   Marital status: Single    Spouse name: Not on file   Number of children: Not on file   Years of education: Not on file   Highest education level: Not on file  Occupational History   Not on file  Tobacco Use   Smoking status: Never  Smokeless tobacco: Never  Substance and Sexual Activity   Alcohol use: No   Drug use: No   Sexual activity: Not Currently  Other Topics Concern   Not on file  Social History Narrative   Not on file   Social Determinants of Health   Financial Resource Strain: Not on file  Food Insecurity: Not on file  Transportation Needs: Not on file  Physical Activity: Not  on file  Stress: Not on file  Social Connections: Not on file    Allergies:  Allergies  Allergen Reactions   Aspirin Other (See Comments)    NOSEBLEEDS    Metabolic Disorder Labs: Lab Results  Component Value Date   HGBA1C 5.6 12/06/2018   MPG 114.02 12/06/2018   No results found for: "PROLACTIN" Lab Results  Component Value Date   CHOL 147 12/06/2018   TRIG 154 (H) 12/06/2018   HDL 44 12/06/2018   CHOLHDL 3.3 12/06/2018   VLDL 31 12/06/2018   LDLCALC 72 12/06/2018   No results found for: "TSH"  Therapeutic Level Labs: No results found for: "LITHIUM" No results found for: "VALPROATE" No results found for: "CBMZ"  Current Medications: Current Outpatient Medications  Medication Sig Dispense Refill   amphetamine-dextroamphetamine (ADDERALL) 10 MG tablet Take 1 tablet (10 mg total) by mouth 2 (two) times daily with a meal. 60 tablet 0   hydrocortisone 2.5 % cream      triamcinolone cream (KENALOG) 0.1 % triamcinolone acetonide 0.1 % topical cream     vortioxetine HBr (TRINTELLIX) 10 MG TABS tablet Take 1.5 tablets (15 mg total) by mouth daily. 45 tablet 1   No current facility-administered medications for this visit.      Psychiatric Specialty Exam: Please note limitations in obtaining a full mental status exam in the context of phone based communication ROS :  none endorsed   There were no vitals taken for this visit.There is no height or weight on file to calculate BMI.  General Appearance: NA  Eye Contact:  NA  Speech:  Normal Rate  Volume:  Normal  Mood: Denies depression and presents euthymic   Affect: full in range /appropriate   Thought Process:  Goal Directed and Linear  Orientation:  Full (Time, Place, and Person)  Thought Content: Denies any self-injurious or suicidal thoughts.  Future oriented.  No HI.  Denies hallucinations.  No delusions are expressed.  Suicidal Thoughts:  No   Homicidal Thoughts:  No  Memory: Recent and remote grossly intact   Judgement:  present  Insight:  present  Psychomotor Activity:  NA  Concentration:  Concentration: Good  Recall:  Good  Fund of Knowledge: Good  Language: Good  Akathisia:  Negative  Handed:  Right  AIMS (if indicated): not done  Assets:  Communication Skills Desire for Improvement Financial Resources/Insurance Housing Social Support Talents/Skills  ADL's:  Intact  Cognition: WNL  Sleep: Reports she is sleeping well/stable.   Screenings: AIMS    Flowsheet Row Ad[facility] (Discharged) [facility]  AIMS Total Score 0      AUDIT    Flowsheet Row Admission (Discharged) [facility]  Alcohol Use Disorder Identification Test Final Score (AUDIT) 0      Flowsheet Row Admission (Discharged) [facility]  C-SSRS RISK CATEGORY Low Risk        Assessment and Plan:  42 year old female.  History of depression, anxiety, narcolepsy by history.   History of a psychiatric admission in 2020 for worsening depression.     Currently she reports she is doing well.  Functioning well in daily activities, including work and home life.  Presents euthymic and does not endorse neurovegetative symptoms or anhedonia.  Narcolepsy symptoms well-controlled.  Anxiety symptoms well-controlled.  Currently tolerating medications well, denies side effects.  States current dose of Adderall effective and identifies Trintellix as helpful and well-tolerated as well. Describes she is facing some stressors related to her children's school.  She is unhappy about the school academics and is concerned her children are falling behind academically.  She is thinking of switching him to another school.  Describes this has been anxiety provoking but less so now that she has started the process. Termination issues  reviewed-writer will be leaving clinic in March 2024.  She expresses understanding.   Continue Adderall 10 mgr BID for history of narcolepsy  Continue Trintellix 15 mgr QDAY  for depression.   Next appointment in about 4 to 6 weeks  Continue individual psychotherapy She agrees to contact clinic sooner should there be any concern or worsening prior.      Craige Cotta, MD 11/14/2022, 9:13 AM  Patient ID: Tara Vega, female   DOB: 07-18-1981, 42 y.o.   MRN: [id number]

## 2022-11-23 ENCOUNTER — Ambulatory Visit: Payer: BC Managed Care – PPO | Admitting: Gastroenterology

## 2022-11-23 ENCOUNTER — Encounter: Payer: Self-pay | Admitting: Gastroenterology

## 2022-11-23 DIAGNOSIS — R110 Nausea: Secondary | ICD-10-CM | POA: Diagnosis not present

## 2022-11-23 DIAGNOSIS — R142 Eructation: Secondary | ICD-10-CM

## 2022-11-23 DIAGNOSIS — R1013 Epigastric pain: Secondary | ICD-10-CM

## 2022-11-23 NOTE — Progress Notes (Signed)
11/23/2022 Tara Vega [id number] Nov 12, 1980   HISTORY OF PRESENT ILLNESS:  This is a 42 year old female who is new to our office.  She has been referred here by Dr. Laural Benes, for evaluation of epigastric abdominal pain, nausea, belching.  She tells me that the pain has been on and off for years, like since she was a teenager.  She says that recently she had an episode of epigastric abdominal pain that was more severe radiating around to her back.  She says that sometimes after she eats she gets pressure in the epigastrium.  If she belches then that relieves it.  Sometimes she has induced vomiting to make it feel better.  Says she also gets the hiccups a couple times a month and gets intense belching with the hiccups as well.  Has been on omeprazole in the past and then recently started again about a month ago but she had worsening of her symptoms.  She thinks that overall she had been off of it for about a month.  Recent H. pylori testing negative, but she says that she had a positive stool Ag in the past and was treated previously.  Recent CBC, CMP, lipase within normal limits.   When asked about other symptoms she reports that she tends to have just very soft stools, not diarrhea.  Says sometimes she does not have a bowel movement every day, at most maybe 2 or 3 times a day.  Most of the time they are not very formed stools.  No rectal bleeding.  Says she has been like this for a long time.   Past Medical History:  Diagnosis Date   Carpal tunnel syndrome, right 10/07/2012   Depression    Headache(784.0)    tension; migraines 1-2 x/month   Reynolds syndrome [facility])    Past Surgical History:  Procedure Laterality Date   ANKLE FRACTURE SURGERY     APPENDECTOMY  02/26/2008   laparoscopic   CARPAL TUNNEL RELEASE  10/11/2012   Procedure: CARPAL TUNNEL RELEASE;  Surgeon: Roseanne Kaufman, MD;  Location: [place];  Service: Orthopedics;  Laterality: Right;   TUBAL LIGATION   01/27/2011   laparoscopic    reports that she has never smoked. She has never used smokeless tobacco. She reports that she does not drink alcohol and does not use drugs. family history includes Breast cancer in her maternal aunt and paternal grandmother; Diabetes in her maternal uncle. Allergies  Allergen Reactions   Aspirin Other (See Comments)    NOSEBLEEDS      Outpatient Encounter Medications as of 11/23/2022  Medication Sig   amLODipine (NORVASC) 5 MG tablet Take 5 mg by mouth daily.   omeprazole (PRILOSEC) 40 MG capsule Take 40 mg by mouth daily.   rizatriptan (MAXALT) 5 MG tablet Take 5 mg by mouth as needed for migraine.   amphetamine-dextroamphetamine (ADDERALL) 10 MG tablet Take 1 tablet (10 mg total) by mouth 2 (two) times daily with a meal.   hydrocortisone 2.5 % cream    triamcinolone cream (KENALOG) 0.1 % triamcinolone acetonide 0.1 % topical cream   vortioxetine HBr (TRINTELLIX) 10 MG TABS tablet Take 1.5 tablets (15 mg total) by mouth daily.   No facility-administered encounter medications on file as of 11/23/2022.     REVIEW OF SYSTEMS  : All other systems reviewed and negative except where noted in the History of Present Illness.   PHYSICAL EXAM: BP 122/72   Pulse 80   Ht 5'  1" (1.549 m)   Wt 227 lb 2 oz (103 kg)   SpO2 98%   BMI 42.91 kg/m  General: Well developed white female in no acute distress Head: Normocephalic and atraumatic Eyes:  Sclerae anicteric, conjunctiva pink. Ears: Normal auditory acuity Lungs: Clear throughout to auscultation; no W/R/R. Heart: Regular rate and rhythm; no M/R/G. Abdomen: Soft, non-distended.  BS present.  Mild epigastric TTP. Musculoskeletal: Symmetrical with no gross deformities  Skin: No lesions on visible extremities Extremities: No edema  Neurological: Alert oriented x 4, grossly non-focal Psychological:  Alert and cooperative. Normal mood and affect  ASSESSMENT AND PLAN: *42 year old female with complaints of  epigastric abdominal pain, nausea, belching.  The pain has been on and off for years.  Has been on omeprazole in the past and then recently started again about a month ago.  Recent H. pylori testing negative.  Recent CBC, CMP, lipase within normal limits.  Will plan for EGD with Dr. Hilarie Fredrickson.  The risks, benefits, and alternatives to EGD were discussed with the patient and she consents to proceed.  *Soft stools: Has anywhere from 0 bowel movements a day up to 2-3 soft bowel movements daily.  Has been this way for a long time.  No rectal bleeding.  Will try Benefiber 2 teaspoons mixed in 8 ounce of liquid daily, can increase to twice daily dosing if tolerated if needed.    CC:  Geannie Risen, MD

## 2022-11-23 NOTE — Patient Instructions (Signed)
If you are age 44 or younger, your body mass index should be between 19-25. Your Body mass index is 42.91 kg/m. If this is out of the aformentioned range listed, please consider follow up with your Primary Care Provider.  ________________________________________________________  The [facility] providers would like to encourage you to use [facility] to communicate with providers for non-urgent requests or questions.  Due to long hold times on the telephone, sending your provider a message by [facility] may be a faster and more efficient way to get a response.  Please allow 48 business hours for a response.  Please remember that this is for non-urgent requests.  _______________________________________________________  Dennis Bast have been scheduled for an endoscopy. Please follow written instructions given to you at your visit today. If you use inhalers (even only as needed), please bring them with you on the day of your procedure.  Due to recent changes in healthcare laws, you may see the results of your imaging and laboratory studies on MyChart before your provider has had a chance to review them.  We understand that in some cases there may be results that are confusing or concerning to you. Not all laboratory results come back in the same time frame and the provider may be waiting for multiple results in order to interpret others.  Please give [place] 48 hours in order for your provider to thoroughly review all the results before contacting the office for clarification of your results.   Please purchase the following medications over the counter and take as directed:  START: Benefiber 2 teaspoons in 8 ounces of liquid daily.  Thank you for entrusting me with your care and choosing [facility].  Alonza Bogus, PA-C

## 2022-11-25 ENCOUNTER — Encounter: Payer: Self-pay | Admitting: Internal Medicine

## 2022-11-25 ENCOUNTER — Ambulatory Visit (AMBULATORY_SURGERY_CENTER): Payer: BC Managed Care – PPO | Admitting: Internal Medicine

## 2022-11-25 VITALS — BP 130/92 | HR 76 | Temp 98.9°F | Resp 11 | Ht 61.0 in | Wt 227.0 lb

## 2022-11-25 DIAGNOSIS — K2950 Unspecified chronic gastritis without bleeding: Secondary | ICD-10-CM

## 2022-11-25 DIAGNOSIS — K2100 Gastro-esophageal reflux disease with esophagitis, without bleeding: Secondary | ICD-10-CM

## 2022-11-25 DIAGNOSIS — R1013 Epigastric pain: Secondary | ICD-10-CM

## 2022-11-25 DIAGNOSIS — R142 Eructation: Secondary | ICD-10-CM

## 2022-11-25 DIAGNOSIS — R110 Nausea: Secondary | ICD-10-CM

## 2022-11-25 MED ORDER — SODIUM CHLORIDE 0.9 % IV SOLN: 500.0000 mL | Freq: Once | INTRAVENOUS | Status: DC

## 2022-11-25 NOTE — Progress Notes (Signed)
Called to room to assist during endoscopic procedure.  Patient ID and intended procedure confirmed with present staff. Received instructions for my participation in the procedure from the performing physician.

## 2022-11-25 NOTE — Progress Notes (Signed)
Pt's states no medical or surgical changes since previsit or office visit. 

## 2022-11-25 NOTE — Progress Notes (Signed)
See office note dated 11/23/2022 for details and current H&P  Patient presenting for upper endoscopy to evaluate epigastric abdominal pain, nausea and belching.  She remains appropriate for upper endoscopy in the [place] today.

## 2022-11-25 NOTE — Op Note (Signed)
[facility] Patient Name: Tara Vega Procedure Date: 11/25/2022 11:10 AM MRN: [id number] Endoscopist: Beverley Fiedler , MD, [id number] Age: 42 Referring MD:  Date of Birth: 1981/03/07 Gender: Female Account #: [id number] Procedure:                Upper GI endoscopy Indications:              Epigastric abdominal pain, Eructation, Nausea; on                            omeprazole 40 mg daily for last month (intermittent                            use over several years) and this helps with                            heartburn, but persistent epigastric pain (episodic) Medicines:                Monitored Anesthesia Care Procedure:                Pre-Anesthesia Assessment:                           - Prior to the procedure, a History and Physical                            was performed, and patient medications and                            allergies were reviewed. The patient's tolerance of                            previous anesthesia was also reviewed. The risks                            and benefits of the procedure and the sedation                            options and risks were discussed with the patient.                            All questions were answered, and informed consent                            was obtained. Prior Anticoagulants: The patient has                            taken no anticoagulant or antiplatelet agents. ASA                            Grade Assessment: III - A patient with severe                            systemic disease. After reviewing the risks and  benefits, the patient was deemed in satisfactory                            condition to undergo the procedure.                           After obtaining informed consent, the endoscope was                            passed under direct vision. Throughout the                            procedure, the patient's blood pressure, pulse, and                             oxygen saturations were monitored continuously. The                            Endoscope was introduced through the mouth, and                            advanced to the second part of duodenum. The upper                            GI endoscopy was accomplished without difficulty.                            The patient tolerated the procedure well. Scope In: Scope Out: Findings:                 The examined esophagus was normal.                           Moderate inflammation characterized by erosions and                            nodularity was found in the gastric antrum.                            Biopsies were taken with a cold forceps for                            histology and Helicobacter pylori testing.                           The examined duodenum was normal. Complications:            No immediate complications. Estimated Blood Loss:     Estimated blood loss was minimal. Impression:               - Normal esophagus.                           - Gastritis. Biopsied to exclude H. Pylori  infection.                           - Normal examined duodenum. Recommendation:           - Patient has a contact number available for                            emergencies. The signs and symptoms of potential                            delayed complications were discussed with the                            patient. Return to normal activities tomorrow.                            Written discharge instructions were provided to the                            patient.                           - Resume previous diet.                           - Continue present medications including omeprazole                            40 mg once daily.                           - Await pathology results. If unrevealing and                            persistent symptoms repeat RUQ [place] to exclude                            gallstones. Jerene Bears, MD 11/25/2022 11:33:59 AM This  report has been signed electronically.

## 2022-11-25 NOTE — Progress Notes (Signed)
Report given to PACU, vss 

## 2022-11-25 NOTE — Progress Notes (Signed)
1120 Robinul 0.1 mg IV given due large amount of secretions upon assessment.  MD made aware, vss  

## 2022-11-25 NOTE — Patient Instructions (Signed)
Handout on gastritis given to you today Await pathology results   YOU HAD AN ENDOSCOPIC PROCEDURE TODAY AT [facility]:   Refer to the procedure report that was given to you for any specific questions about what was found during the examination.  If the procedure report does not answer your questions, please call your gastroenterologist to clarify.  If you requested that your care partner not be given the details of your procedure findings, then the procedure report has been included in a sealed envelope for you to review at your convenience later.  YOU SHOULD EXPECT: Some feelings of bloating in the abdomen. Passage of more gas than usual.  Walking can help get rid of the air that was put into your GI tract during the procedure and reduce the bloating. If you had a lower endoscopy (such as a colonoscopy or flexible sigmoidoscopy) you may notice spotting of blood in your stool or on the toilet paper. If you underwent a bowel prep for your procedure, you may not have a normal bowel movement for a few days.  Please Note:  You might notice some irritation and congestion in your nose or some drainage.  This is from the oxygen used during your procedure.  There is no need for concern and it should clear up in a day or so.  SYMPTOMS TO REPORT IMMEDIATELY:  Following upper endoscopy (EGD)  Vomiting of blood or coffee ground material  New chest pain or pain under the shoulder blades  Painful or persistently difficult swallowing  New shortness of breath  Fever of 100F or higher  Black, tarry-looking stools  For urgent or emergent issues, a gastroenterologist can be reached at any hour by calling [phone]. Do not use MyChart messaging for urgent concerns.    DIET:  We do recommend a small meal at first, but then you may proceed to your regular diet.  Drink plenty of fluids but you should avoid alcoholic beverages for 24 hours.  ACTIVITY:  You should plan to take it easy for the  rest of today and you should NOT DRIVE or use heavy machinery until tomorrow (because of the sedation medicines used during the test).    FOLLOW UP: Our staff will call the number listed on your records the next business day following your procedure.  We will call around 7:15- 8:00 am to check on you and address any questions or concerns that you may have regarding the information given to you following your procedure. If we do not reach you, we will leave a message.     If any biopsies were taken you will be contacted by phone or by letter within the next 1-3 weeks.  Please call us at [phone] if you have not heard about the biopsies in 3 weeks.    SIGNATURES/CONFIDENTIALITY: You and/or your care partner have signed paperwork which will be entered into your electronic medical record.  These signatures attest to the fact that that the information above on your After Visit Summary has been reviewed and is understood.  Full responsibility of the confidentiality of this discharge information lies with you and/or your care-partner.

## 2022-11-25 NOTE — Progress Notes (Signed)
Addendum: Reviewed and agree with assessment and management plan. Aliha Diedrich M, MD  

## 2022-11-28 ENCOUNTER — Telehealth: Payer: Self-pay | Admitting: *Deleted

## 2022-11-28 NOTE — Telephone Encounter (Signed)
  Follow up Call-    Row Labels 11/25/2022   10:55 AM  Call back number   Section Header. No data exists in this row.   Post procedure Call Back phone  #   [phone]  Permission to leave phone message   Yes     Patient questions:  Do you have a fever, pain , or abdominal swelling? No. Pain Score  0 *  Have you tolerated food without any problems? Yes.    Have you been able to return to your normal activities? Yes.    Do you have any questions about your discharge instructions: Diet   No. Medications  No. Follow up visit  No.  Do you have questions or concerns about your Care? No.  Actions: * If pain score is 4 or above: No action needed, pain <4.

## 2022-11-30 ENCOUNTER — Encounter: Payer: Self-pay | Admitting: Internal Medicine

## 2022-12-19 ENCOUNTER — Other Ambulatory Visit (HOSPITAL_COMMUNITY): Payer: Self-pay | Admitting: Psychiatry

## 2022-12-21 ENCOUNTER — Telehealth (HOSPITAL_COMMUNITY): Payer: Self-pay | Admitting: *Deleted

## 2022-12-21 MED ORDER — AMPHETAMINE-DEXTROAMPHETAMINE 10 MG PO TABS: 10.0000 mg | ORAL_TABLET | Freq: Two times a day (BID) | ORAL | 0 refills | Status: DC

## 2022-12-21 NOTE — Telephone Encounter (Signed)
Thank you. Will do. LVM.

## 2022-12-21 NOTE — Telephone Encounter (Signed)
Done. Please inform patient

## 2022-12-21 NOTE — Telephone Encounter (Signed)
Pt of Dr. Parke Poisson called requesting a refill of the generic Adderall 10 mg BID. Last sent on 11/14/22 for #60 to [facility]. Pt has an upcoming appointment on 12/26/22. Please review.

## 2022-12-26 ENCOUNTER — Encounter (HOSPITAL_COMMUNITY): Payer: Self-pay | Admitting: Psychiatry

## 2022-12-26 ENCOUNTER — Telehealth (HOSPITAL_BASED_OUTPATIENT_CLINIC_OR_DEPARTMENT_OTHER): Payer: BC Managed Care – PPO | Admitting: Psychiatry

## 2022-12-26 DIAGNOSIS — F325 Major depressive disorder, single episode, in full remission: Secondary | ICD-10-CM | POA: Diagnosis not present

## 2022-12-26 MED ORDER — AMPHETAMINE-DEXTROAMPHETAMINE 10 MG PO TABS: 10.0000 mg | ORAL_TABLET | Freq: Two times a day (BID) | ORAL | 0 refills | Status: DC

## 2022-12-26 MED ORDER — VORTIOXETINE HBR 10 MG PO TABS: 15.0000 mg | ORAL_TABLET | Freq: Every day | ORAL | 1 refills | Status: DC

## 2022-12-26 NOTE — Progress Notes (Signed)
BH MD/PA/NP OP Progress Note  12/26/2022 2:12 PM Tara Vega  MRN:  [id number] Interview was conducted by phone and I verified that I was speaking with the correct person using two identifiers. I discussed the limitations of evaluation and management by telemedicine and  the availability of in person appointments. Patient expressed understanding and agreed to proceed. Participants in the visit: patient (location - home); physician (location -clinic office). Duration 20  minutes   Chief Complaint: Medication management appointment.  Patient was being followed by Dr. Montel Culver, who has retired. I am providing outpatient psychiatric care pending new psychiatrist starting .  HPI:   42 year old female.  History of depression, anxiety, narcolepsy by history.   History of a psychiatric admission in 2020 for worsening depression.   Tara Vega reports she is doing well .   Functioning well in daily activities, including work and home life.  Describes mood as stable and presents euthymic, with a full range of affect . Does not endorse any anhedonia or other neuro-vegetative symptoms at this time . Anxiety symptoms also described as well controlled .  A recent stressor pertained to feeling her children ( ages 18, 57-they both go to the same private school) were falling behind academically . She reports she spoke with school officials and feels issue is being addressed .   Narcolepsy symptoms well-controlled.  Denies drowsiness or episodes of falling asleep . Denies history of cataplexy .   Tolerating medications well . She reports Trintellix has been very helpful and has noted it to be effective to address mood and anxiety symptoms. Also tolerating Adderall well , denies side effects. She has a history of HTN for which she takes Amlodipine, reports she gets BP checked regularly and has been within normal.  She has a history of migraines and is prescribed Maxalt, which she takes occasionally. Have  reviewed potential drug drug interactions such as potential risk for serotonin syndrome .   Termination issues reviewed-writer is  leaving clinic in March 2024.  She will continue management with another [facility] clinic psychiatrist She expresses understanding.      Visit Diagnosis:  MDD by history Narcolepsy by history  Past Psychiatric History: Please see intake H&P.  Past Medical History:  Past Medical History:  Diagnosis Date   Carpal tunnel syndrome, right 10/07/2012   Depression    Headache(784.0)    tension; migraines 1-2 x/month   Reynolds syndrome [facility])     Past Surgical History:  Procedure Laterality Date   ANKLE FRACTURE SURGERY     APPENDECTOMY  02/26/2008   laparoscopic   CARPAL TUNNEL RELEASE  10/11/2012   Procedure: CARPAL TUNNEL RELEASE;  Surgeon: Roseanne Kaufman, MD;  Location: [place];  Service: Orthopedics;  Laterality: Right;   TUBAL LIGATION  01/27/2011   laparoscopic   UPPER GASTROINTESTINAL ENDOSCOPY      Family Psychiatric History: None.  Family History:  Family History  Problem Relation Age of Onset   Breast cancer Paternal Grandmother    Breast cancer Maternal Aunt    Diabetes Maternal Uncle     Social History:  Social History   Socioeconomic History   Marital status: Single    Spouse name: Not on file   Number of children: 4   Years of education: Not on file   Highest education level: Not on file  Occupational History   Not on file  Tobacco Use   Smoking status: Never   Smokeless tobacco: Never  Vaping Use  Vaping Use: Never used  Substance and Sexual Activity   Alcohol use: No   Drug use: No   Sexual activity: Not Currently    Birth control/protection: Surgical, I.U.D.  Other Topics Concern   Not on file  Social History Narrative   Not on file   Social Determinants of Health   Financial Resource Strain: Not on file  Food Insecurity: Not on file  Transportation Needs: Not on file  Physical Activity: Not  on file  Stress: Not on file  Social Connections: Not on file    Allergies:  Allergies  Allergen Reactions   Aspirin Other (See Comments)    NOSEBLEEDS    Metabolic Disorder Labs: Lab Results  Component Value Date   HGBA1C 5.6 12/06/2018   MPG 114.02 12/06/2018   No results found for: "PROLACTIN" Lab Results  Component Value Date   CHOL 147 12/06/2018   TRIG 154 (H) 12/06/2018   HDL 44 12/06/2018   CHOLHDL 3.3 12/06/2018   VLDL 31 12/06/2018   LDLCALC 72 12/06/2018   No results found for: "TSH"  Therapeutic Level Labs: No results found for: "LITHIUM" No results found for: "VALPROATE" No results found for: "CBMZ"  Current Medications: Current Outpatient Medications  Medication Sig Dispense Refill   amLODipine (NORVASC) 5 MG tablet Take 5 mg by mouth daily.     amphetamine-dextroamphetamine (ADDERALL) 10 MG tablet Take 1 tablet (10 mg total) by mouth 2 (two) times daily with a meal. 60 tablet 0   omeprazole (PRILOSEC) 40 MG capsule Take 40 mg by mouth daily.     rizatriptan (MAXALT) 5 MG tablet Take 5 mg by mouth as needed for migraine.     vortioxetine HBr (TRINTELLIX) 10 MG TABS tablet Take 1.5 tablets (15 mg total) by mouth daily. 45 tablet 1   No current facility-administered medications for this visit.      Psychiatric Specialty Exam: Please note limitations in obtaining a full mental status exam in the context of phone based communication ROS :  none endorsed   There were no vitals taken for this visit.There is no height or weight on file to calculate BMI.  General Appearance: NA  Eye Contact:  NA  Speech:  Normal Rate  Volume:  Normal  Mood: Denies depression and presents euthymic   Affect: full in range /appropriate   Thought Process:  Goal Directed and Linear  Orientation:  Full (Time, Place, and Person)  Thought Content: Denies any self-injurious or suicidal thoughts.  Future oriented.  No HI.  Denies hallucinations.  No delusions are expressed.   Suicidal Thoughts:  No   Homicidal Thoughts:  No  Memory: Recent and remote grossly intact  Judgement:  present  Insight:  present  Psychomotor Activity:  NA  Concentration:  Concentration: Good  Recall:  Good  Fund of Knowledge: Good  Language: Good  Akathisia:  Negative  Handed:  Right  AIMS (if indicated): not done  Assets:  Communication Skills Desire for Improvement Financial Resources/Insurance Housing Social Support Talents/Skills  ADL's:  Intact  Cognition: WNL  Sleep: Reports she is sleeping well/stable.   Screenings: AIMS    Flowsheet Row Admission (Discharged) from OP Visit from 12/06/2018 in [facility]  AIMS Total Score 0      AUDIT    Flowsheet Row Admission (Discharged) from OP Visit from 12/06/2018 in [facility]  Alcohol Use Disorder Identification Test Final Score (AUDIT) 0  Flowsheet Row Admission (Discharged) from OP Visit from 12/06/2018 in [facility]  C-SSRS RISK CATEGORY Low Risk        Assessment and Plan:   42 year old female.  History of depression, anxiety, narcolepsy by history.   History of a psychiatric admission in 2020 for worsening depression.    Tara Clavin reports she is doing well .   Functioning well in daily activities, including work and home life.  Describes mood as stable and presents euthymic, with a full range of affect . Does not endorse any anhedonia or other neuro-vegetative symptoms at this time . Anxiety symptoms also described as well controlled .  A recent stressor pertained to feeling her children ( ages 93, 26-they both go to the same private school) were falling behind academically . She reports she spoke with school officials and feels issue is being addressed .   Narcolepsy symptoms well-controlled.  Denies drowsiness or episodes of falling asleep . Denies history of cataplexy .   Tolerating medications  well . She reports Trintellix has been very helpful and has noted it to be effective to address mood and anxiety symptoms. Also tolerating Adderall well , denies side effects. She has a history of HTN for which she takes Amlodipine, reports she gets BP checked regularly and has been within normal.  Termination issues reviewed-writer is  leaving clinic in March 2024.  She will continue management with another [facility] clinic psychiatrist She expresses understanding.   Continue Adderall 10 mgr BID for history of narcolepsy  Continue Trintellix 15 mgr QDAY  for depression.   Next appointment in about 4 to 6 weeks  She agrees to contact clinic sooner should there be any concern or worsening prior.      Jenne Campus, MD 12/26/2022, 2:12 PM  Patient ID: Tara Vega, female   DOB: 07-05-1981, 42 y.o.   MRN: [id number]

## 2023-01-16 DIAGNOSIS — R059 Cough, unspecified: Secondary | ICD-10-CM | POA: Diagnosis not present

## 2023-02-09 ENCOUNTER — Telehealth (HOSPITAL_BASED_OUTPATIENT_CLINIC_OR_DEPARTMENT_OTHER): Payer: BC Managed Care – PPO | Admitting: Psychiatry

## 2023-02-09 ENCOUNTER — Encounter (HOSPITAL_COMMUNITY): Payer: Self-pay | Admitting: Psychiatry

## 2023-02-09 DIAGNOSIS — G47419 Narcolepsy without cataplexy: Secondary | ICD-10-CM

## 2023-02-09 DIAGNOSIS — F3341 Major depressive disorder, recurrent, in partial remission: Secondary | ICD-10-CM | POA: Diagnosis not present

## 2023-02-09 MED ORDER — VORTIOXETINE HBR 10 MG PO TABS: 15.0000 mg | ORAL_TABLET | Freq: Every day | ORAL | 1 refills | Status: DC

## 2023-02-09 MED ORDER — AMPHETAMINE-DEXTROAMPHETAMINE 10 MG PO TABS: 10.0000 mg | ORAL_TABLET | Freq: Two times a day (BID) | ORAL | 0 refills | Status: DC
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
## 2023-02-14 ENCOUNTER — Telehealth (HOSPITAL_COMMUNITY): Payer: Self-pay | Admitting: *Deleted

## 2023-02-14 NOTE — Telephone Encounter (Signed)
PA FOR TRINTELLIX 10 MG #45 SUBMITTED TO [employer] VIA COVERMYMEDS.  AWAITING DETERMINATION.  KEY: [id number]

## 2023-02-16 ENCOUNTER — Telehealth (HOSPITAL_COMMUNITY): Payer: Self-pay

## 2023-02-16 NOTE — Telephone Encounter (Signed)
Medication management - Prior authorization for patient's prescribed Trintellix 10 mg tablets completed online with CoverMyMeds and decision pending with [employer] of .

## 2023-03-16 ENCOUNTER — Encounter (HOSPITAL_COMMUNITY): Payer: BC Managed Care – PPO | Admitting: Psychiatry

## 2023-03-16 ENCOUNTER — Encounter (HOSPITAL_COMMUNITY): Payer: Self-pay

## 2023-03-16 NOTE — Progress Notes (Signed)
This encounter was created in error - please disregard.

## 2023-03-16 NOTE — Progress Notes (Deleted)
BH MD/PA/NP OP Progress Note  03/16/2023 12:16 PM Tara Vega  MRN:  [id number]  Visit Diagnosis:    ICD-10-CM   1. Primary narcolepsy without cataplexy  G47.419     2. Recurrent major depressive disorder, in partial remission (HCC)  F33.41       Assessment: Tara Vega is a 42 y.o. female with a history of MDD, GAD, and narcolepsy who presented to [facility] at [facility] for initial evaluation on 02/09/2023 following transfer from Dr. Jama Flavors.  Tara Vega presents for follow-up evaluation. Today, 03/16/23, patient reports ***  that her narcolepsy and depression are well controlled on her current regimen. She denies any adverse side effects.   Plan: - Continue Adderall 10 mg BID for history of narcolepsy  - Continue Trintellix 15 mg QD  for depression. - Crisis resources reviewed - Follow up in month   Chief Complaint:  Chief Complaint  Patient presents with   Follow-up   HPI: ***   Past Psychiatric History: History of depression, anxiety, narcolepsy by history. History of a psychiatric admission in 2020 for worsening depression. Had followed with Dr. Hinton Dyer before transferring to Dr. Jama Flavors in the past.  Past Medical History:  Past Medical History:  Diagnosis Date   Carpal tunnel syndrome, right 10/07/2012   Depression    Headache(784.0)    tension; migraines 1-2 x/month   Reynolds syndrome [facility])     Past Surgical History:  Procedure Laterality Date   ANKLE FRACTURE SURGERY     APPENDECTOMY  02/26/2008   laparoscopic   CARPAL TUNNEL RELEASE  10/11/2012   Procedure: CARPAL TUNNEL RELEASE;  Surgeon: Dominica Severin, MD;  Location: [facility];  Service: Orthopedics;  Laterality: Right;   TUBAL LIGATION  01/27/2011   laparoscopic   UPPER GASTROINTESTINAL ENDOSCOPY     Family History:  Family History  Problem Relation Age of Onset   Breast cancer Paternal Grandmother    Breast cancer Maternal Aunt    Diabetes  Maternal Uncle     Social History:  Social History   Socioeconomic History   Marital status: Single    Spouse name: Not on file   Number of children: 4   Years of education: Not on file   Highest education level: Not on file  Occupational History   Not on file  Tobacco Use   Smoking status: Never   Smokeless tobacco: Never  Vaping Use   Vaping Use: Never used  Substance and Sexual Activity   Alcohol use: No   Drug use: No   Sexual activity: Not Currently    Birth control/protection: Surgical, I.U.D.  Other Topics Concern   Not on file  Social History Narrative   Not on file   Social Determinants of Health   Financial Resource Strain: Not on file  Food Insecurity: Not on file  Transportation Needs: Not on file  Physical Activity: Not on file  Stress: Not on file  Social Connections: Not on file    Allergies:  Allergies  Allergen Reactions   Aspirin Other (See Comments)    NOSEBLEEDS    Current Medications: Current Outpatient Medications  Medication Sig Dispense Refill   amLODipine (NORVASC) 5 MG tablet Take 5 mg by mouth daily.     amphetamine-dextroamphetamine (ADDERALL) 10 MG tablet Take 1 tablet (10 mg total) by mouth 2 (two) times daily with a meal. 60 tablet 0   omeprazole (PRILOSEC) 40 MG capsule Take 40 mg by mouth daily.  rizatriptan (MAXALT) 5 MG tablet Take 5 mg by mouth as needed for migraine.     vortioxetine HBr (TRINTELLIX) 10 MG TABS tablet Take 1.5 tablets (15 mg total) by mouth daily. 45 tablet 1   No current facility-administered medications for this visit.     Psychiatric Specialty Exam: Review of Systems  There were no vitals taken for this visit.There is no height or weight on file to calculate BMI.  General Appearance: {Appearance:22683}  Eye Contact:  {BHH EYE CONTACT:22684}  Speech:  {Speech:22685}  Volume:  {Volume (PAA):22686}  Mood:  {BHH MOOD:22306}  Affect:  {Affect (PAA):22687}  Thought Process:  {Thought Process  (PAA):22688}  Orientation:  {BHH ORIENTATION (PAA):22689}  Thought Content: {Thought Content:22690}   Suicidal Thoughts:  {ST/HT (PAA):22692}  Homicidal Thoughts:  {ST/HT (PAA):22692}  Memory:  {BHH MEMORY:22881}  Judgement:  {Judgement (PAA):22694}  Insight:  {Insight (PAA):22695}  Psychomotor Activity:  {Psychomotor (PAA):22696}  Concentration:  {Concentration:21399}  Recall:  {BHH GOOD/FAIR/POOR:22877}  Fund of Knowledge: {BHH GOOD/FAIR/POOR:22877}  Language: {BHH GOOD/FAIR/POOR:22877}  Akathisia:  {BHH YES OR NO:22294}    AIMS (if indicated): {Desc; done/not:10129}  Assets:  {Assets (PAA):22698}  ADL's:  {BHH ZOX'W:96045}  Cognition: {chl bhh cognition:304700322}  Sleep:  {BHH GOOD/FAIR/POOR:22877}   Metabolic Disorder Labs: Lab Results  Component Value Date   HGBA1C 5.6 12/06/2018   MPG 114.02 12/06/2018   No results found for: "PROLACTIN" Lab Results  Component Value Date   CHOL 147 12/06/2018   TRIG 154 (H) 12/06/2018   HDL 44 12/06/2018   CHOLHDL 3.3 12/06/2018   VLDL 31 12/06/2018   LDLCALC 72 12/06/2018   No results found for: "TSH"  Therapeutic Level Labs: No results found for: "LITHIUM" No results found for: "VALPROATE" No results found for: "CBMZ"   Screenings: AIMS    Flowsheet Row Admission (Discharged) [facility]  AIMS Total Score 0      AUDIT    Flowsheet Row Admission (Discharged) from [facility]  Alcohol Use Disorder Identification Test Final Score (AUDIT) 0      Flowsheet Row Admission (Discharged) from [facility]  C-SSRS RISK CATEGORY Low Risk       Collaboration of Care: Collaboration of Care: Medication Management AEB ***  Patient/Guardian was advised Release of Information must be obtained prior to any record release in order to  collaborate their care with an outside provider. Patient/Guardian was advised if they have not already done so to contact the registration department to sign all necessary forms in order for [place] to release information regarding their care.   Consent: Patient/Guardian gives verbal consent for treatment and assignment of benefits for services provided during this visit. Patient/Guardian expressed understanding and agreed to proceed.    Stasia Cavalier, MD 03/16/2023, 12:16 PM   Virtual Visit via Video Note  I connected with Tara Vega on 03/16/23 at  3:00 PM EDT by a video enabled telemedicine application and verified that I am speaking with the correct person using two identifiers.  Location: Patient: Home Provider: Home Office   I discussed the limitations of evaluation and management by telemedicine and the availability of in person appointments. The patient expressed understanding and agreed to proceed.   I discussed the assessment and treatment plan with the patient. The patient was provided an opportunity to ask questions and all were answered. The  patient agreed with the plan and demonstrated an understanding of the instructions.   The patient was advised to call back or seek an in-person evaluation if the symptoms worsen or if the condition fails to improve as anticipated.  I provided *** minutes of non-face-to-face time during this encounter.   Stasia Cavalier, MD

## 2023-03-23 ENCOUNTER — Telehealth (HOSPITAL_BASED_OUTPATIENT_CLINIC_OR_DEPARTMENT_OTHER): Payer: BC Managed Care – PPO | Admitting: Psychiatry

## 2023-03-23 DIAGNOSIS — F3341 Major depressive disorder, recurrent, in partial remission: Secondary | ICD-10-CM

## 2023-03-23 DIAGNOSIS — G47419 Narcolepsy without cataplexy: Secondary | ICD-10-CM

## 2023-03-23 MED ORDER — VORTIOXETINE HBR 10 MG PO TABS
15.0000 mg | ORAL_TABLET | Freq: Every day | ORAL | 1 refills | Status: DC
Start: 2023-03-23 — End: 2023-04-26

## 2023-03-23 MED ORDER — AMPHETAMINE-DEXTROAMPHETAMINE 10 MG PO TABS
10.0000 mg | ORAL_TABLET | Freq: Two times a day (BID) | ORAL | 0 refills | Status: DC
Start: 2023-03-23 — End: 2023-04-26

## 2023-03-23 NOTE — Progress Notes (Signed)
BH MD/PA/NP OP Progress Note  03/23/2023 10:00 AM Tara Vega  MRN:  [id number]  Visit Diagnosis:    ICD-10-CM   1. Recurrent major depressive disorder, in partial remission (HCC)  F33.41 amphetamine-dextroamphetamine (ADDERALL) 10 MG tablet    vortioxetine HBr (TRINTELLIX) 10 MG TABS tablet    2. Primary narcolepsy without cataplexy  G47.419 amphetamine-dextroamphetamine (ADDERALL) 10 MG tablet    vortioxetine HBr (TRINTELLIX) 10 MG TABS tablet      Assessment: Tara Vega is a 42 y.o. female with a history of MDD, GAD, and narcolepsy who presented to [facility] at [facility] for initial evaluation on 02/09/2023 following transfer from Dr. Jama Flavors.  Tara Vega Overall presents for follow-up evaluation. Today, 03/23/23, patient reports that the narcolepsy is well controlled on the Adderall.  She has felt a little bit more depressed and anxious recently due to increase in psychosocial stressors.  Patient could benefit from medication adjustments but declined at this time in part due to experiencing nausea from Trintellix 15 mg.  She has adjusted the dosing to 5 mg in the morning and 10 mg at bedtime.  We also suggested therapy the patient was open to reaching out to her former therapist.  Plan: - Continue Adderall 10 mg BID for history of narcolepsy  - Continue Trintellix 5 mg QD and 10 mg nightly for depression. - She has a therapist she will reach out to. - Crisis resources reviewed - Follow up in month   Chief Complaint:  Chief Complaint  Patient presents with   Follow-up   HPI: Tara Vega presents today reporting that she has been busy with work this past month and overall things been very stressful.  Her son who has reportedly is about to turn 41 and when he does she will no longer receive a check that was used to help support him.  Patient notes that while he does have cerebral palsy is not to the point of disability.  She has concerns about how the financial  situation will be managed as she is already working 2 jobs to make ends meet.  Her son is going to try to get a job as a Teaching laboratory technician to help out financially.  She notes a lot of anxiety about what is going to happen in the next month related to this.  We discussed options including medication adjustments and therapy.  Patient notes that she is not interested in a medication increase at this time.  She is already taking the Trintellix 10 mg at night and 5 mg in the morning due to nausea from taking the 50 mg all at once.  She had tried taking it with food and still experiences the nausea.  As per therapy patient has someone she can reach out to if she plans to do.  She has some financial concerns about seeing a therapist but does agree that it could be helpful for her during this time.  Past Psychiatric History: History of depression, anxiety, narcolepsy by history. History of a psychiatric admission in 2020 for worsening depression. Had followed with Dr. Hinton Dyer before transferring to Dr. Jama Flavors in the past.   Past Medical History:  Past Medical History:  Diagnosis Date   Carpal tunnel syndrome, right 10/07/2012   Depression    Headache(784.0)    tension; migraines 1-2 x/month   Reynolds syndrome [facility])     Past Surgical History:  Procedure Laterality Date   ANKLE FRACTURE SURGERY     APPENDECTOMY  02/26/2008   laparoscopic   CARPAL TUNNEL RELEASE  10/11/2012   Procedure: CARPAL TUNNEL RELEASE;  Surgeon: Dominica Severin, MD;  Location: [facility];  Service: Orthopedics;  Laterality: Right;   TUBAL LIGATION  01/27/2011   laparoscopic   UPPER GASTROINTESTINAL ENDOSCOPY     Family History:  Family History  Problem Relation Age of Onset   Breast cancer Paternal Grandmother    Breast cancer Maternal Aunt    Diabetes Maternal Uncle     Social History:  Social History   Socioeconomic History   Marital status: Single    Spouse name: Not on file   Number of children: 4    Years of education: Not on file   Highest education level: Not on file  Occupational History   Not on file  Tobacco Use   Smoking status: Never   Smokeless tobacco: Never  Vaping Use   Vaping Use: Never used  Substance and Sexual Activity   Alcohol use: No   Drug use: No   Sexual activity: Not Currently    Birth control/protection: Surgical, I.U.D.  Other Topics Concern   Not on file  Social History Narrative   Not on file   Social Determinants of Health   Financial Resource Strain: Not on file  Food Insecurity: Not on file  Transportation Needs: Not on file  Physical Activity: Not on file  Stress: Not on file  Social Connections: Not on file    Allergies:  Allergies  Allergen Reactions   Aspirin Other (See Comments)    NOSEBLEEDS    Current Medications: Current Outpatient Medications  Medication Sig Dispense Refill   amLODipine (NORVASC) 5 MG tablet Take 5 mg by mouth daily.     amphetamine-dextroamphetamine (ADDERALL) 10 MG tablet Take 1 tablet (10 mg total) by mouth 2 (two) times daily with a meal. 60 tablet 0   omeprazole (PRILOSEC) 40 MG capsule Take 40 mg by mouth daily.     rizatriptan (MAXALT) 5 MG tablet Take 5 mg by mouth as needed for migraine.     vortioxetine HBr (TRINTELLIX) 10 MG TABS tablet Take 1.5 tablets (15 mg total) by mouth daily. 45 tablet 1   No current facility-administered medications for this visit.     Psychiatric Specialty Exam: Review of Systems  There were no vitals taken for this visit.There is no height or weight on file to calculate BMI.  General Appearance: Fairly Groomed  Eye Contact:  Good  Speech:  Clear and Coherent and Normal Rate  Volume:  Normal  Mood:  Anxious and Depressed  Affect:  Congruent, Labile, and Tearful  Thought Process:  Coherent and Goal Directed  Orientation:  Full (Time, Place, and Person)  Thought Content: Logical   Suicidal Thoughts:  No  Homicidal Thoughts:  No  Memory:  Immediate;   Good   Judgement:  Good  Insight:  Fair  Psychomotor Activity:  Normal  Concentration:  Concentration: Good  Recall:  Good  Fund of Knowledge: Good  Language: Good  Akathisia:  NA    AIMS (if indicated): not done  Assets:  Communication Skills Desire for Improvement Housing Resilience Transportation Vocational/Educational  ADL's:  Intact  Cognition: WNL  Sleep:  Good   Metabolic Disorder Labs: Lab Results  Component Value Date   HGBA1C 5.6 12/06/2018   MPG 114.02 12/06/2018   No results found for: "PROLACTIN" Lab Results  Component Value Date   CHOL 147 12/06/2018   TRIG 154 (H) 12/06/2018  HDL 44 12/06/2018   CHOLHDL 3.3 12/06/2018   VLDL 31 12/06/2018   LDLCALC 72 12/06/2018   No results found for: "TSH"  Therapeutic Level Labs: No results found for: "LITHIUM" No results found for: "VALPROATE" No results found for: "CBMZ"   Screenings: AIMS    Flowsheet Row Admission (Discharged) [facility]  AIMS Total Score 0      AUDIT    Flowsheet Row Admission (Discharged) from [facility]  Alcohol Use Disorder Identification Test Final Score (AUDIT) 0      Flowsheet Row Admission (Discharged) from [facility]  C-SSRS RISK CATEGORY Low Risk       Collaboration of Care: Collaboration of Care: Medication Management AEB medication prescription   Patient/Guardian was advised Release of Information must be obtained prior to any record release in order to collaborate their care with an outside provider. Patient/Guardian was advised if they have not already done so to contact the registration department to sign all necessary forms in order for [place] to release information regarding their care.   Consent: Patient/Guardian gives verbal consent for treatment and assignment of benefits for  services provided during this visit. Patient/Guardian expressed understanding and agreed to proceed.    Stasia Cavalier, MD 03/23/2023, 10:00 AM   Virtual Visit via Video Note  I connected with Elenore Paddy on 03/23/23 at  9:30 AM EDT by a video enabled telemedicine application and verified that I am speaking with the correct person using two identifiers.  Location: Patient: In her car Provider: Home Office   I discussed the limitations of evaluation and management by telemedicine and the availability of in person appointments. The patient expressed understanding and agreed to proceed.   I discussed the assessment and treatment plan with the patient. The patient was provided an opportunity to ask questions and all were answered. The patient agreed with the plan and demonstrated an understanding of the instructions.   The patient was advised to call back or seek an in-person evaluation if the symptoms worsen or if the condition fails to improve as anticipated.  I provided 15 minutes of non-face-to-face time during this encounter.   Stasia Cavalier, MD

## 2023-04-20 ENCOUNTER — Encounter (HOSPITAL_COMMUNITY): Payer: Self-pay

## 2023-04-20 ENCOUNTER — Encounter (HOSPITAL_COMMUNITY): Payer: BC Managed Care – PPO | Admitting: Psychiatry

## 2023-04-20 NOTE — Progress Notes (Signed)
This encounter was created in error - please disregard.

## 2023-04-25 NOTE — Progress Notes (Unsigned)
BH MD/PA/NP OP Progress Note  04/26/2023 8:39 AM Tara Vega  MRN:  [id number]  Visit Diagnosis:    ICD-10-CM   1. Primary narcolepsy without cataplexy  G47.419 vortioxetine HBr (TRINTELLIX) 10 MG TABS tablet    amphetamine-dextroamphetamine (ADDERALL) 10 MG tablet    2. Recurrent major depressive disorder, in partial remission (HCC)  F33.41 vortioxetine HBr (TRINTELLIX) 10 MG TABS tablet    amphetamine-dextroamphetamine (ADDERALL) 10 MG tablet      Assessment: Tara Vega is a 42 y.o. female with a history of MDD, GAD, and narcolepsy who presented to [facility] at [facility] for initial evaluation on 02/09/2023 following transfer from Dr. Jama Flavors.  Tara Vega presents for follow-up evaluation. Today, 04/26/23, patient reports that her depression has improved and her anxiety is better controlled this past month. She attributes the improvement to the medications and restarting therapy. She denies any adverse medication side effects. We will continue on her current regimen and follow up in a month.   Plan: - Continue Adderall 10 mg BID for history of narcolepsy  - Continue Trintellix 5 mg QD and 10 mg nightly for depression. - Continue with therapy every 3 weeks - Crisis resources reviewed - Follow up in month   Chief Complaint:  Chief Complaint  Patient presents with   Follow-up   HPI: Tara Vega presents reporting that she has been feeling better this past month. Her mood has been less depressed and anxiety has been less intrusive. While she does still have some concerns about finances when her son turns 53, it is much better controlled and less debilitating. She feels that the Trintellix has been helping in keeping her mood steady. In addition she did restart therapy a couple weeks ago with plans to meet every 3 weeks. Restarting therapy has also helped shift her mindset on this. Patient denies any other concerns at this time noting that sleep and appetite are  also fairly stable today.  We did discuss no show policy with the patient and she expressed her understanding.   Past Psychiatric History: History of depression, anxiety, narcolepsy by history. History of a psychiatric admission in 2020 for worsening depression. Had followed with Dr. Hinton Dyer before transferring to Dr. Jama Flavors in the past.   Past Medical History:  Past Medical History:  Diagnosis Date   Carpal tunnel syndrome, right 10/07/2012   Depression    Headache(784.0)    tension; migraines 1-2 x/month   Reynolds syndrome [facility])     Past Surgical History:  Procedure Laterality Date   ANKLE FRACTURE SURGERY     APPENDECTOMY  02/26/2008   laparoscopic   CARPAL TUNNEL RELEASE  10/11/2012   Procedure: CARPAL TUNNEL RELEASE;  Surgeon: Dominica Severin, MD;  Location: [facility];  Service: Orthopedics;  Laterality: Right;   TUBAL LIGATION  01/27/2011   laparoscopic   UPPER GASTROINTESTINAL ENDOSCOPY     Family History:  Family History  Problem Relation Age of Onset   Breast cancer Paternal Grandmother    Breast cancer Maternal Aunt    Diabetes Maternal Uncle     Social History:  Social History   Socioeconomic History   Marital status: Single    Spouse name: Not on file   Number of children: 4   Years of education: Not on file   Highest education level: Not on file  Occupational History   Not on file  Tobacco Use   Smoking status: Never   Smokeless tobacco: Never  Vaping  Use   Vaping Use: Never used  Substance and Sexual Activity   Alcohol use: No   Drug use: No   Sexual activity: Not Currently    Birth control/protection: Surgical, I.U.D.  Other Topics Concern   Not on file  Social History Narrative   Not on file   Social Determinants of Health   Financial Resource Strain: Not on file  Food Insecurity: Not on file  Transportation Needs: Not on file  Physical Activity: Not on file  Stress: Not on file  Social Connections: Not on file     Allergies:  Allergies  Allergen Reactions   Aspirin Other (See Comments)    NOSEBLEEDS    Current Medications: Current Outpatient Medications  Medication Sig Dispense Refill   amLODipine (NORVASC) 5 MG tablet Take 5 mg by mouth daily.     amphetamine-dextroamphetamine (ADDERALL) 10 MG tablet Take 1 tablet (10 mg total) by mouth 2 (two) times daily with a meal. 60 tablet 0   omeprazole (PRILOSEC) 40 MG capsule Take 40 mg by mouth daily.     rizatriptan (MAXALT) 5 MG tablet Take 5 mg by mouth as needed for migraine.     vortioxetine HBr (TRINTELLIX) 10 MG TABS tablet Take 1.5 tablets (15 mg total) by mouth daily. 135 tablet 0   No current facility-administered medications for this visit.     Psychiatric Specialty Exam: Review of Systems  There were no vitals taken for this visit.There is no height or weight on file to calculate BMI.  General Appearance: Fairly Groomed  Eye Contact:  Good  Speech:  Clear and Coherent and Normal Rate  Volume:  Normal  Mood:  Euthymic and mild anxiety  Affect:  Congruent  Thought Process:  Coherent and Goal Directed  Orientation:  Full (Time, Place, and Person)  Thought Content: Logical   Suicidal Thoughts:  No  Homicidal Thoughts:  No  Memory:  Immediate;   Good  Judgement:  Good  Insight:  Fair  Psychomotor Activity:  Normal  Concentration:  Concentration: Good  Recall:  Good  Fund of Knowledge: Good  Language: Good  Akathisia:  NA    AIMS (if indicated): not done  Assets:  Communication Skills Desire for Improvement Housing Resilience Transportation Vocational/Educational  ADL's:  Intact  Cognition: WNL  Sleep:  Good   Metabolic Disorder Labs: Lab Results  Component Value Date   HGBA1C 5.6 12/06/2018   MPG 114.02 12/06/2018   No results found for: "PROLACTIN" Lab Results  Component Value Date   CHOL 147 12/06/2018   TRIG 154 (H) 12/06/2018   HDL 44 12/06/2018   CHOLHDL 3.3 12/06/2018   VLDL 31 12/06/2018    LDLCALC 72 12/06/2018   No results found for: "TSH"  Therapeutic Level Labs: No results found for: "LITHIUM" No results found for: "VALPROATE" No results found for: "CBMZ"   Screenings: AIMS    Flowsheet Row Admission (Discharged) [facility]  AIMS Total Score 0      AUDIT    Flowsheet Row Admission (Discharged) from [facility]  Alcohol Use Disorder Identification Test Final Score (AUDIT) 0      Flowsheet Row Admission (Discharged) from [facility]  C-SSRS RISK CATEGORY Low Risk       Collaboration of Care: Collaboration of Care: Medication Management AEB medication prescription   Patient/Guardian  was advised Release of Information must be obtained prior to any record release in order to collaborate their care with an outside provider. Patient/Guardian was advised if they have not already done so to contact the registration department to sign all necessary forms in order for [place] to release information regarding their care.   Consent: Patient/Guardian gives verbal consent for treatment and assignment of benefits for services provided during this visit. Patient/Guardian expressed understanding and agreed to proceed.    Stasia Cavalier, MD 04/26/2023, 8:39 AM   Virtual Visit via Video Note  I connected with Elenore Paddy on 04/26/23 at  8:30 AM EDT by a video enabled telemedicine application and verified that I am speaking with the correct person using two identifiers.  Location: Patient: In her car Provider: Home Office   I discussed the limitations of evaluation and management by telemedicine and the availability of in person appointments. The patient expressed understanding and agreed to proceed.   I discussed the assessment and treatment plan with the patient. The patient was  provided an opportunity to ask questions and all were answered. The patient agreed with the plan and demonstrated an understanding of the instructions.   The patient was advised to call back or seek an in-person evaluation if the symptoms worsen or if the condition fails to improve as anticipated.  I provided 10 minutes of non-face-to-face time during this encounter.   Stasia Cavalier, MD

## 2023-04-26 ENCOUNTER — Telehealth (HOSPITAL_COMMUNITY): Payer: Self-pay | Admitting: *Deleted

## 2023-04-26 ENCOUNTER — Encounter (HOSPITAL_COMMUNITY): Payer: Self-pay | Admitting: Psychiatry

## 2023-04-26 ENCOUNTER — Telehealth (HOSPITAL_BASED_OUTPATIENT_CLINIC_OR_DEPARTMENT_OTHER): Payer: BC Managed Care – PPO | Admitting: Psychiatry

## 2023-04-26 DIAGNOSIS — G47419 Narcolepsy without cataplexy: Secondary | ICD-10-CM

## 2023-04-26 DIAGNOSIS — F3341 Major depressive disorder, recurrent, in partial remission: Secondary | ICD-10-CM | POA: Diagnosis not present

## 2023-04-26 MED ORDER — AMPHETAMINE-DEXTROAMPHETAMINE 10 MG PO TABS
10.0000 mg | ORAL_TABLET | Freq: Two times a day (BID) | ORAL | 0 refills | Status: DC
Start: 2023-04-26 — End: 2023-05-30

## 2023-04-26 MED ORDER — VORTIOXETINE HBR 10 MG PO TABS: 15.0000 mg | ORAL_TABLET | Freq: Every day | ORAL | 0 refills | Status: DC

## 2023-04-26 NOTE — Telephone Encounter (Signed)
PA FOR TRINTELLIX 10 MG TABLETS #135 SUBMITTED TO INSURANCE VIA [employer].  AWAITING DETERMINATION.

## 2023-04-26 NOTE — Telephone Encounter (Signed)
PA for Trintellix 10 mg #135 has been APPROVED by [facility] from 04/26/23 through 04/25/24.

## 2023-05-29 NOTE — Progress Notes (Unsigned)
BH MD/PA/NP OP Progress Note  05/30/2023 8:57 AM Tara Vega  MRN:  [id number]  Visit Diagnosis:    ICD-10-CM   1. Primary narcolepsy without cataplexy  G47.419 amphetamine-dextroamphetamine (ADDERALL) 10 MG tablet    2. Recurrent major depressive disorder, in partial remission (HCC)  F33.41 amphetamine-dextroamphetamine (ADDERALL) 10 MG tablet       Assessment: Tara Vega is a 42 y.o. female with a history of MDD, GAD, and narcolepsy who presented to [facility] for initial evaluation on 02/09/2023 following transfer from Dr. Jama Flavors.  Tara Vega presents for follow-up evaluation. Today, 05/30/23, patient reports that her depression and anxiety have remained well-controlled over the past month.  She continues to take the medication without any adverse side effects.  Patient has no are significant benefit from restarting therapy and he continues to meet with a therapist every 3 to 4 weeks.  We will continue on her current regimen and follow-up in a month.  Plan: - Continue Adderall 10 mg BID for history of narcolepsy  - Continue Trintellix 5 mg QD and 10 mg nightly for depression. - Continue with therapy every 3-4 weeks - Crisis resources reviewed - Follow up in month   Chief Complaint:  Chief Complaint  Patient presents with   Follow-up   HPI: Tara Vega presents reporting that she has been pretty good over the past month.  Things at work and home have both been going well.  Her son took drivers ed classroom piece. She is proud of how she is handling everything as her son seems to be growing up so quick.  Tara Vega does have some anxiety about when he is on the road but has been working with him and thinks things are going pretty well.  She is still seeing her therapist which has been very helpful and attributes this to the major shift in her anxiety symptoms.  She also does continue to take the medication and finds that both the Trintellix and Adderall  helpful as well.  She denies any adverse side effects from the medication.  We discussed the possibility of urine screens in the future to which patient was amenable.  Past Psychiatric History: History of depression, anxiety, narcolepsy by history. History of a psychiatric admission in 2020 for worsening depression. Had followed with Dr. Hinton Dyer before transferring to Dr. Jama Flavors in the past.   Past Medical History:  Past Medical History:  Diagnosis Date   Carpal tunnel syndrome, right 10/07/2012   Depression    Headache(784.0)    tension; migraines 1-2 x/month   Reynolds syndrome [facility])     Past Surgical History:  Procedure Laterality Date   ANKLE FRACTURE SURGERY     APPENDECTOMY  02/26/2008   laparoscopic   CARPAL TUNNEL RELEASE  10/11/2012   Procedure: CARPAL TUNNEL RELEASE;  Surgeon: Dominica Severin, MD;  Location: [facility];  Service: Orthopedics;  Laterality: Right;   TUBAL LIGATION  01/27/2011   laparoscopic   UPPER GASTROINTESTINAL ENDOSCOPY     Family History:  Family History  Problem Relation Age of Onset   Breast cancer Paternal Grandmother    Breast cancer Maternal Aunt    Diabetes Maternal Uncle     Social History:  Social History   Socioeconomic History   Marital status: Single    Spouse name: Not on file   Number of children: 4   Years of education: Not on file   Highest education level: Not on file  Occupational History   Not on file  Tobacco Use   Smoking status: Never   Smokeless tobacco: Never  Vaping Use   Vaping status: Never Used  Substance and Sexual Activity   Alcohol use: No   Drug use: No   Sexual activity: Not Currently    Birth control/protection: Surgical, I.U.D.  Other Topics Concern   Not on file  Social History Narrative   Not on file   Social Determinants of Health   Financial Resource Strain: Not on file  Food Insecurity: Not on file  Transportation Needs: Not on file  Physical Activity: Not on file   Stress: Not on file  Social Connections: Unknown (03/11/2022)   Received from [facility]   Social Network    Social Network: Not on file    Allergies:  Allergies  Allergen Reactions   Aspirin Other (See Comments)    NOSEBLEEDS    Current Medications: Current Outpatient Medications  Medication Sig Dispense Refill   amLODipine (NORVASC) 5 MG tablet Take 5 mg by mouth daily.     amphetamine-dextroamphetamine (ADDERALL) 10 MG tablet Take 1 tablet (10 mg total) by mouth 2 (two) times daily with a meal. 60 tablet 0   omeprazole (PRILOSEC) 40 MG capsule Take 40 mg by mouth daily.     rizatriptan (MAXALT) 5 MG tablet Take 5 mg by mouth as needed for migraine.     vortioxetine HBr (TRINTELLIX) 10 MG TABS tablet Take 1.5 tablets (15 mg total) by mouth daily. 135 tablet 0   No current facility-administered medications for this visit.     Psychiatric Specialty Exam: Review of Systems  There were no vitals taken for this visit.There is no height or weight on file to calculate BMI.  General Appearance: Fairly Groomed  Eye Contact:  Good  Speech:  Clear and Coherent and Normal Rate  Volume:  Normal  Mood:  Euthymic  Affect:  Congruent  Thought Process:  Coherent and Goal Directed  Orientation:  Full (Time, Place, and Person)  Thought Content: Logical   Suicidal Thoughts:  No  Homicidal Thoughts:  No  Memory:  Immediate;   Good  Judgement:  Good  Insight:  Fair  Psychomotor Activity:  Normal  Concentration:  Concentration: Good  Recall:  Good  Fund of Knowledge: Good  Language: Good  Akathisia:  NA    AIMS (if indicated): not done  Assets:  Communication Skills Desire for Improvement Housing Resilience Transportation Vocational/Educational  ADL's:  Intact  Cognition: WNL  Sleep:  Good   Metabolic Disorder Labs: Lab Results  Component Value Date   HGBA1C 5.6 12/06/2018   MPG 114.02 12/06/2018   No results found for: "PROLACTIN" Lab Results   Component Value Date   CHOL 147 12/06/2018   TRIG 154 (H) 12/06/2018   HDL 44 12/06/2018   CHOLHDL 3.3 12/06/2018   VLDL 31 12/06/2018   LDLCALC 72 12/06/2018   No results found for: "TSH"  Therapeutic Level Labs: No results found for: "LITHIUM" No results found for: "VALPROATE" No results found for: "CBMZ"   Screenings: AIMS    Flowsheet Row Admission (Discharged) [facility]  AIMS Total Score 0      AUDIT    Flowsheet Row Admission (Discharged) from [facility]  Alcohol Use Disorder Identification Test Final Score (AUDIT) 0      Flowsheet Row Admission (Discharged)  from [facility]  C-SSRS RISK CATEGORY Low Risk       Collaboration of Care: Collaboration of Care: Medication Management AEB medication prescription   Patient/Guardian was advised Release of Information must be obtained prior to any record release in order to collaborate their care with an outside provider. Patient/Guardian was advised if they have not already done so to contact the registration department to sign all necessary forms in order for [place] to release information regarding their care.   Consent: Patient/Guardian gives verbal consent for treatment and assignment of benefits for services provided during this visit. Patient/Guardian expressed understanding and agreed to proceed.    Stasia Cavalier, MD 05/30/2023, 8:57 AM   Virtual Visit via Video Note  I connected with Elenore Paddy on 05/30/23 at  8:30 AM EDT by a video enabled telemedicine application and verified that I am speaking with the correct person using two identifiers.  Location: Patient: In her car Provider: Home Office   I discussed the limitations of evaluation and management by telemedicine and the availability of in person appointments. The  patient expressed understanding and agreed to proceed.   I discussed the assessment and treatment plan with the patient. The patient was provided an opportunity to ask questions and all were answered. The patient agreed with the plan and demonstrated an understanding of the instructions.   The patient was advised to call back or seek an in-person evaluation if the symptoms worsen or if the condition fails to improve as anticipated.  I provided 10 minutes of non-face-to-face time during this encounter.   Stasia Cavalier, MD

## 2023-05-30 ENCOUNTER — Encounter (HOSPITAL_COMMUNITY): Payer: Self-pay | Admitting: Psychiatry

## 2023-05-30 ENCOUNTER — Telehealth (HOSPITAL_BASED_OUTPATIENT_CLINIC_OR_DEPARTMENT_OTHER): Payer: BC Managed Care – PPO | Admitting: Psychiatry

## 2023-05-30 DIAGNOSIS — G47419 Narcolepsy without cataplexy: Secondary | ICD-10-CM | POA: Diagnosis not present

## 2023-05-30 DIAGNOSIS — F3341 Major depressive disorder, recurrent, in partial remission: Secondary | ICD-10-CM | POA: Diagnosis not present

## 2023-05-30 DIAGNOSIS — F411 Generalized anxiety disorder: Secondary | ICD-10-CM | POA: Diagnosis not present

## 2023-05-30 MED ORDER — AMPHETAMINE-DEXTROAMPHETAMINE 10 MG PO TABS
10.0000 mg | ORAL_TABLET | Freq: Two times a day (BID) | ORAL | 0 refills | Status: DC
Start: 2023-05-30 — End: 2023-06-30

## 2023-06-29 NOTE — Progress Notes (Signed)
BH MD/PA/NP OP Progress Note  06/30/2023 8:18 AM Tara Vega  MRN:  [id number]  Visit Diagnosis:    ICD-10-CM   1. Primary narcolepsy without cataplexy  G47.419 amphetamine-dextroamphetamine (ADDERALL) 10 MG tablet    amphetamine-dextroamphetamine (ADDERALL) 10 MG tablet    vortioxetine HBr (TRINTELLIX) 10 MG TABS tablet    2. Recurrent major depressive disorder, in partial remission (HCC)  F33.41 amphetamine-dextroamphetamine (ADDERALL) 10 MG tablet    amphetamine-dextroamphetamine (ADDERALL) 10 MG tablet    vortioxetine HBr (TRINTELLIX) 10 MG TABS tablet        Assessment: Tara Vega is a 42 y.o. female with a history of MDD, GAD, and narcolepsy who presented to [facility] at [facility] for initial evaluation on 02/09/2023 following transfer from Dr. Jama Flavors.  Tara Vega presents for follow-up evaluation. Today, 06/30/23, patient reports    that her depression and anxiety have remained well-controlled over the past month.  She continues to take the medication without any adverse side effects.  Patient has no are significant benefit from restarting therapy and he continues to meet with a therapist every 3 to 4 weeks.  We will continue on her current regimen and follow-up in a month.  Plan: - Continue Adderall 10 mg BID for history of narcolepsy  - Continue Trintellix 5 mg QD and 10 mg nightly for depression. - Continue with therapy every 3-4 weeks - Crisis resources reviewed - Follow up in month   Chief Complaint:  Chief Complaint  Patient presents with   Follow-up   HPI: Tara Vega presents reporting that things have been going pretty good the past month. She had the anniversary of her brothers death at the end of the month which she thinks she handled quite well. Tara Vega had met with her therapist before which was helpful. Financially things still are tight but she is taking it day by day. Work is picking up at her second job which has been helpful. Tara Vega  notes that she has options for other jobs if things do get more difficult in the future. Her only other concern was about her son turning 4 and if she needs to do anything to continue to make medical decisions. We reviewed with her that he would need to sign an ROI for her to still participate in his medical care. Then she can continue to make decisions as long as he is agreeable. If he is not she may need to pursue guardianship.  Tara Vega has been taking her medications without any issue. She has tried to take Adderall less frequently on the weekends, though finds that she does need one dose typically. Her stomach has also gotten used to the Trintellix and she is no longer getting the uncomfortable feeling she had previously. With this she is consistently taking the 15 mg dose which she finds helpful.  Past Psychiatric History: History of depression, anxiety, narcolepsy by history. History of a psychiatric admission in 2020 for worsening depression. Had followed with Dr. Hinton Dyer before transferring to Dr. Jama Flavors in the past.   Past Medical History:  Past Medical History:  Diagnosis Date   Carpal tunnel syndrome, right 10/07/2012   Depression    Headache(784.0)    tension; migraines 1-2 x/month   Reynolds syndrome [facility])     Past Surgical History:  Procedure Laterality Date   ANKLE FRACTURE SURGERY     APPENDECTOMY  02/26/2008   laparoscopic   CARPAL TUNNEL RELEASE  10/11/2012   Procedure: CARPAL TUNNEL RELEASE;  Surgeon: Dominica Severin, MD;  Location: [facility];  Service: Orthopedics;  Laterality: Right;   TUBAL LIGATION  01/27/2011   laparoscopic   UPPER GASTROINTESTINAL ENDOSCOPY     Family History:  Family History  Problem Relation Age of Onset   Breast cancer Paternal Grandmother    Breast cancer Maternal Aunt    Diabetes Maternal Uncle     Social History:  Social History   Socioeconomic History   Marital status: Single    Spouse name: Not on file   Number  of children: 4   Years of education: Not on file   Highest education level: Not on file  Occupational History   Not on file  Tobacco Use   Smoking status: Never   Smokeless tobacco: Never  Vaping Use   Vaping status: Never Used  Substance and Sexual Activity   Alcohol use: No   Drug use: No   Sexual activity: Not Currently    Birth control/protection: Surgical, I.U.D.  Other Topics Concern   Not on file  Social History Narrative   Not on file   Social Determinants of Health   Financial Resource Strain: Not on file  Food Insecurity: Not on file  Transportation Needs: Not on file  Physical Activity: Not on file  Stress: Not on file  Social Connections: Unknown (03/11/2022)   Received from [facility]   Social Network    Social Network: Not on file    Allergies:  Allergies  Allergen Reactions   Aspirin Other (See Comments)    NOSEBLEEDS    Current Medications: Current Outpatient Medications  Medication Sig Dispense Refill   [START ON 07/28/2023] amphetamine-dextroamphetamine (ADDERALL) 10 MG tablet Take 1 tablet (10 mg total) by mouth 2 (two) times daily with a meal. 60 tablet 0   amLODipine (NORVASC) 5 MG tablet Take 5 mg by mouth daily.     amphetamine-dextroamphetamine (ADDERALL) 10 MG tablet Take 1 tablet (10 mg total) by mouth 2 (two) times daily with a meal. 60 tablet 0   omeprazole (PRILOSEC) 40 MG capsule Take 40 mg by mouth daily.     rizatriptan (MAXALT) 5 MG tablet Take 5 mg by mouth as needed for migraine.     vortioxetine HBr (TRINTELLIX) 10 MG TABS tablet Take 1.5 tablets (15 mg total) by mouth daily. 135 tablet 0   No current facility-administered medications for this visit.     Psychiatric Specialty Exam: Review of Systems  There were no vitals taken for this visit.There is no height or weight on file to calculate BMI.  General Appearance: Well Groomed  Eye Contact:  Good  Speech:  Clear and Coherent and Normal Rate  Volume:   Normal  Mood:  Euthymic  Affect:  Congruent  Thought Process:  Coherent and Goal Directed  Orientation:  Full (Time, Place, and Person)  Thought Content: Logical   Suicidal Thoughts:  No  Homicidal Thoughts:  No  Memory:  Immediate;   Good  Judgement:  Good  Insight:  Fair  Psychomotor Activity:  Normal  Concentration:  Concentration: Good  Recall:  Good  Fund of Knowledge: Good  Language: Good  Akathisia:  NA    AIMS (if indicated): not done  Assets:  Communication Skills Desire for Improvement Housing Resilience Transportation Vocational/Educational  ADL's:  Intact  Cognition: WNL  Sleep:  Good   Metabolic Disorder Labs: Lab Results  Component Value Date   HGBA1C 5.6 12/06/2018   MPG 114.02 12/06/2018  No results found for: "PROLACTIN" Lab Results  Component Value Date   CHOL 147 12/06/2018   TRIG 154 (H) 12/06/2018   HDL 44 12/06/2018   CHOLHDL 3.3 12/06/2018   VLDL 31 12/06/2018   LDLCALC 72 12/06/2018   No results found for: "TSH"  Therapeutic Level Labs: No results found for: "LITHIUM" No results found for: "VALPROATE" No results found for: "CBMZ"   Screenings: AIMS    Flowsheet Row Admission (Discharged) [facility]  AIMS Total Score 0      AUDIT    Flowsheet Row Admission (Discharged) from [facility]  Alcohol Use Disorder Identification Test Final Score (AUDIT) 0      Flowsheet Row Admission (Discharged) from [facility]  C-SSRS RISK CATEGORY Low Risk       Collaboration of Care: Collaboration of Care: Medication Management AEB medication prescription   Patient/Guardian was advised Release of Information must be obtained prior to any record release in order to collaborate their care with an outside provider. Patient/Guardian was advised if they  have not already done so to contact the registration department to sign all necessary forms in order for [place] to release information regarding their care.   Consent: Patient/Guardian gives verbal consent for treatment and assignment of benefits for services provided during this visit. Patient/Guardian expressed understanding and agreed to proceed.    Stasia Cavalier, MD 06/30/2023, 8:18 AM   Virtual Visit via Video Note  I connected with Elenore Paddy on 06/30/23 at  8:00 AM EDT by a video enabled telemedicine application and verified that I am speaking with the correct person using two identifiers.  Location: Patient: In her car Provider: Home Office   I discussed the limitations of evaluation and management by telemedicine and the availability of in person appointments. The patient expressed understanding and agreed to proceed.   I discussed the assessment and treatment plan with the patient. The patient was provided an opportunity to ask questions and all were answered. The patient agreed with the plan and demonstrated an understanding of the instructions.   The patient was advised to call back or seek an in-person evaluation if the symptoms worsen or if the condition fails to improve as anticipated.  I provided 10 minutes of non-face-to-face time during this encounter.   Stasia Cavalier, MD

## 2023-06-30 ENCOUNTER — Encounter (HOSPITAL_COMMUNITY): Payer: Self-pay | Admitting: Psychiatry

## 2023-06-30 ENCOUNTER — Telehealth (HOSPITAL_COMMUNITY): Payer: BC Managed Care – PPO | Admitting: Psychiatry

## 2023-06-30 DIAGNOSIS — G47419 Narcolepsy without cataplexy: Secondary | ICD-10-CM | POA: Diagnosis not present

## 2023-06-30 DIAGNOSIS — F3341 Major depressive disorder, recurrent, in partial remission: Secondary | ICD-10-CM | POA: Diagnosis not present

## 2023-06-30 MED ORDER — VORTIOXETINE HBR 10 MG PO TABS: 15.0000 mg | ORAL_TABLET | Freq: Every day | ORAL | 0 refills | Status: DC

## 2023-06-30 MED ORDER — AMPHETAMINE-DEXTROAMPHETAMINE 10 MG PO TABS
10.0000 mg | ORAL_TABLET | Freq: Two times a day (BID) | ORAL | 0 refills | Status: DC
Start: 2023-06-30 — End: 2023-08-25

## 2023-06-30 MED ORDER — AMPHETAMINE-DEXTROAMPHETAMINE 10 MG PO TABS
10.0000 mg | ORAL_TABLET | Freq: Two times a day (BID) | ORAL | 0 refills | Status: DC
Start: 2023-07-28 — End: 2023-08-25

## 2023-08-23 NOTE — Progress Notes (Signed)
BH MD/PA/NP OP Progress Note  08/25/2023 8:19 AM Tara Vega  MRN:  [id number]  Visit Diagnosis:    ICD-10-CM   1. Primary narcolepsy without cataplexy  G47.419 amphetamine-dextroamphetamine (ADDERALL) 10 MG tablet    amphetamine-dextroamphetamine (ADDERALL) 10 MG tablet    vortioxetine HBr (TRINTELLIX) 10 MG TABS tablet    2. Recurrent major depressive disorder, in partial remission (HCC)  F33.41 amphetamine-dextroamphetamine (ADDERALL) 10 MG tablet    amphetamine-dextroamphetamine (ADDERALL) 10 MG tablet    vortioxetine HBr (TRINTELLIX) 10 MG TABS tablet      Assessment: Tara Vega is a 42 y.o. female with a history of MDD, GAD, and narcolepsy who presented to [facility] at [facility] for initial evaluation on 02/09/2023 following transfer from Dr. Jama Flavors.  Tara Vega presents for follow-up evaluation. Today, 08/25/23, patient reports that her depression and anxiety have remained stable over the past couple months.  She continues to take medications and denies any adverse side effects.  She continues to benefit through therapy.  We will continue on her current regimen and follow-up in 2 months.  Plan: - Continue Adderall 10 mg BID for history of narcolepsy  - Continue Trintellix 5 mg QD and 10 mg nightly for depression. - Continue with therapy every 3-4 weeks - Crisis resources reviewed - Follow up in 2 months  Chief Complaint:  Chief Complaint  Patient presents with   Follow-up   HPI: Tara Vega presents reporting that the last couple months have gone well.  She has kept busy with work at both of her jobs and reports that they are going well.  At home things have also been good and her son is doing well in his last semester of school.  There have not been any issues with his medical care and involving her since he turned 18.  Tara Vega reports that her mood and anxiety have remained stable and the current medication regimen seems to working well.  She denies  any adverse side effects.    Past Psychiatric History: History of depression, anxiety, narcolepsy by history. History of a psychiatric admission in 2020 for worsening depression. Had followed with Dr. Hinton Dyer before transferring to Dr. Jama Flavors in the past.   Past Medical History:  Past Medical History:  Diagnosis Date   Carpal tunnel syndrome, right 10/07/2012   Depression    Headache(784.0)    tension; migraines 1-2 x/month   Reynolds syndrome [facility])     Past Surgical History:  Procedure Laterality Date   ANKLE FRACTURE SURGERY     APPENDECTOMY  02/26/2008   laparoscopic   CARPAL TUNNEL RELEASE  10/11/2012   Procedure: CARPAL TUNNEL RELEASE;  Surgeon: Dominica Severin, MD;  Location: [facility];  Service: Orthopedics;  Laterality: Right;   TUBAL LIGATION  01/27/2011   laparoscopic   UPPER GASTROINTESTINAL ENDOSCOPY     Family History:  Family History  Problem Relation Age of Onset   Breast cancer Paternal Grandmother    Breast cancer Maternal Aunt    Diabetes Maternal Uncle     Social History:  Social History   Socioeconomic History   Marital status: Single    Spouse name: Not on file   Number of children: 4   Years of education: Not on file   Highest education level: Not on file  Occupational History   Not on file  Tobacco Use   Smoking status: Never   Smokeless tobacco: Never  Vaping Use   Vaping status: Never Used  Substance and Sexual Activity   Alcohol use: No   Drug use: No   Sexual activity: Not Currently    Birth control/protection: Surgical, I.U.D.  Other Topics Concern   Not on file  Social History Narrative   Not on file   Social Determinants of Health   Financial Resource Strain: Not on file  Food Insecurity: Not on file  Transportation Needs: Not on file  Physical Activity: Not on file  Stress: Not on file  Social Connections: Unknown (03/11/2022)   Received from [facility]   Social Network    Social  Network: Not on file    Allergies:  Allergies  Allergen Reactions   Aspirin Other (See Comments)    NOSEBLEEDS    Current Medications: Current Outpatient Medications  Medication Sig Dispense Refill   amLODipine (NORVASC) 5 MG tablet Take 5 mg by mouth daily.     [START ON 09/25/2023] amphetamine-dextroamphetamine (ADDERALL) 10 MG tablet Take 1 tablet (10 mg total) by mouth 2 (two) times daily with a meal. 60 tablet 0   amphetamine-dextroamphetamine (ADDERALL) 10 MG tablet Take 1 tablet (10 mg total) by mouth 2 (two) times daily with a meal. 60 tablet 0   omeprazole (PRILOSEC) 40 MG capsule Take 40 mg by mouth daily.     rizatriptan (MAXALT) 5 MG tablet Take 5 mg by mouth as needed for migraine.     vortioxetine HBr (TRINTELLIX) 10 MG TABS tablet Take 1.5 tablets (15 mg total) by mouth daily. 135 tablet 0   No current facility-administered medications for this visit.     Psychiatric Specialty Exam: Review of Systems  There were no vitals taken for this visit.There is no height or weight on file to calculate BMI.  General Appearance: Well Groomed  Eye Contact:  Good  Speech:  Clear and Coherent and Normal Rate  Volume:  Normal  Mood:  Euthymic  Affect:  Congruent  Thought Process:  Coherent and Goal Directed  Orientation:  Full (Time, Place, and Person)  Thought Content: Logical   Suicidal Thoughts:  No  Homicidal Thoughts:  No  Memory:  Immediate;   Good  Judgement:  Good  Insight:  Fair  Psychomotor Activity:  Normal  Concentration:  Concentration: Good  Recall:  Good  Fund of Knowledge: Good  Language: Good  Akathisia:  NA    AIMS (if indicated): not done  Assets:  Communication Skills Desire for Improvement Housing Resilience Transportation Vocational/Educational  ADL's:  Intact  Cognition: WNL  Sleep:  Good   Metabolic Disorder Labs: Lab Results  Component Value Date   HGBA1C 5.6 12/06/2018   MPG 114.02 12/06/2018   No results found for:  "PROLACTIN" Lab Results  Component Value Date   CHOL 147 12/06/2018   TRIG 154 (H) 12/06/2018   HDL 44 12/06/2018   CHOLHDL 3.3 12/06/2018   VLDL 31 12/06/2018   LDLCALC 72 12/06/2018   No results found for: "TSH"  Therapeutic Level Labs: No results found for: "LITHIUM" No results found for: "VALPROATE" No results found for: "CBMZ"   Screenings: AIMS    Flowsheet Row Admission (Discharged) from [facility]  AIMS Total Score 0      AUDIT    Flowsheet Row Admission (Discharged) from [facility]  Alcohol Use Disorder Identification Test Final Score (AUDIT) 0      Flowsheet Row Admission (Discharged) from OP  [facility]  C-SSRS RISK CATEGORY Low Risk       Collaboration of Care: Collaboration of Care: Medication Management AEB medication prescription   Patient/Guardian was advised Release of Information must be obtained prior to any record release in order to collaborate their care with an outside provider. Patient/Guardian was advised if they have not already done so to contact the registration department to sign all necessary forms in order for [place] to release information regarding their care.   Consent: Patient/Guardian gives verbal consent for treatment and assignment of benefits for services provided during this visit. Patient/Guardian expressed understanding and agreed to proceed.    Stasia Cavalier, MD 08/25/2023, 8:19 AM   Virtual Visit via Video Note  I connected with Elenore Paddy on 08/25/23 at  8:00 AM EDT by a video enabled telemedicine application and verified that I am speaking with the correct person using two identifiers.  Location: Patient: In her car Provider: Home Office   I discussed the limitations of evaluation and management by telemedicine and the availability of in  person appointments. The patient expressed understanding and agreed to proceed.   I discussed the assessment and treatment plan with the patient. The patient was provided an opportunity to ask questions and all were answered. The patient agreed with the plan and demonstrated an understanding of the instructions.   The patient was advised to call back or seek an in-person evaluation if the symptoms worsen or if the condition fails to improve as anticipated.  I provided 10 minutes of non-face-to-face time during this encounter.   Stasia Cavalier, MD

## 2023-08-25 ENCOUNTER — Telehealth (HOSPITAL_BASED_OUTPATIENT_CLINIC_OR_DEPARTMENT_OTHER): Payer: BC Managed Care – PPO | Admitting: Psychiatry

## 2023-08-25 ENCOUNTER — Encounter (HOSPITAL_COMMUNITY): Payer: Self-pay | Admitting: Psychiatry

## 2023-08-25 DIAGNOSIS — G47419 Narcolepsy without cataplexy: Secondary | ICD-10-CM

## 2023-08-25 DIAGNOSIS — F3341 Major depressive disorder, recurrent, in partial remission: Secondary | ICD-10-CM | POA: Diagnosis not present

## 2023-08-25 MED ORDER — AMPHETAMINE-DEXTROAMPHETAMINE 10 MG PO TABS
10.0000 mg | ORAL_TABLET | Freq: Two times a day (BID) | ORAL | 0 refills | Status: DC
Start: 2023-08-25 — End: 2023-10-27

## 2023-08-25 MED ORDER — AMPHETAMINE-DEXTROAMPHETAMINE 10 MG PO TABS
10.0000 mg | ORAL_TABLET | Freq: Two times a day (BID) | ORAL | 0 refills | Status: DC
Start: 2023-09-25 — End: 2023-10-27

## 2023-08-25 MED ORDER — VORTIOXETINE HBR 10 MG PO TABS
15.0000 mg | ORAL_TABLET | Freq: Every day | ORAL | 0 refills | Status: DC
Start: 2023-08-25 — End: 2023-10-27

## 2023-10-23 NOTE — Progress Notes (Signed)
BH MD/PA/NP OP Progress Note  10/27/2023 8:20 AM Tara Vega  MRN:  [id number]  Visit Diagnosis:    ICD-10-CM   1. Primary narcolepsy without cataplexy  G47.419 amphetamine-dextroamphetamine (ADDERALL) 10 MG tablet    amphetamine-dextroamphetamine (ADDERALL) 10 MG tablet    vortioxetine HBr (TRINTELLIX) 10 MG TABS tablet    2. Recurrent major depressive disorder, in partial remission (HCC)  F33.41 amphetamine-dextroamphetamine (ADDERALL) 10 MG tablet    amphetamine-dextroamphetamine (ADDERALL) 10 MG tablet    vortioxetine HBr (TRINTELLIX) 10 MG TABS tablet    propranolol (INDERAL) 10 MG tablet      Assessment: Tara Vega is a 42 y.o. female with a history of MDD, GAD, and narcolepsy who presented to [facility] at [facility] for initial evaluation on 02/09/2023 following transfer from Dr. Jama Flavors.  Elon Alas Maltese presents for follow-up evaluation. Today, 10/27/23, patient reports that she has been doing well until around 2 weeks ago.  The anxiety of the holidays have started to come significant with patient having a couple of panic attacks a week.  She does believe these are primarily related to situational stressors and will improve after the holidays.  Patient had thought of medication changes though preferred to stay on her current regimen at this time.  After discussion she was open to adding a as needed medication for anxiety.  Will start propranolol and risk and benefits were reviewed.  We will continue on the remainder of her current regimen and follow-up in 2 months.  Plan: - Continue Adderall 10 mg BID for history of narcolepsy  - Continue Trintellix 5 mg QD and 10 mg nightly for depression. - Start propranolol 10 mg BID prn for anxiety - Continue with therapy every 3-4 weeks - Crisis resources reviewed - Follow up in 2 months  Chief Complaint:  Chief Complaint  Patient presents with   Follow-up   HPI: Tara Vega presents reporting that the last 2 months  have been going ok. The last few weeks things have been rough emotionally and her anxiety has been getting the best of her. She had been having around 2 mental breakdowns a week while at work.  Patient believes this is primarily around the holidays and is associated stress.  She wants to make sure her kids are happy and she is giving them everything they want but financially that has been problematic.  She is working on accepting that she is doing a great job as a single parent and that her kids will be happy just to spend time with her.  They are scheduled to go on vacation to the beach after Christmas which she thinks that they will enjoy.  Support was provided around this and patient reports that she is still attending therapy.  With the increased stress she had thought about reaching out in the interim about medication adjustments.  However she opted not to as her medications had been good for an extended period and she thinks this is more situational.  We discussed some options for the medications including potential titration of Trintellix or addition of an as needed medication.  Patient was open to adding an as needed medication for her anxiety during these times.  On past review she has tried hydroxyzine though notes that it was over sedating.  Patient has not tried propranolol and blood pressure/pulse is stable.  Risk and benefits of this medication were reviewed.  Past Psychiatric History: History of depression, anxiety, narcolepsy by history. History of a psychiatric  admission in 2020 for worsening depression. Had followed with Dr. Hinton Dyer before transferring to Dr. Jama Flavors in the past.   Past Medical History:  Past Medical History:  Diagnosis Date   Carpal tunnel syndrome, right 10/07/2012   Depression    Headache(784.0)    tension; migraines 1-2 x/month   Reynolds syndrome [facility])     Past Surgical History:  Procedure Laterality Date   ANKLE FRACTURE SURGERY     APPENDECTOMY  02/26/2008    laparoscopic   CARPAL TUNNEL RELEASE  10/11/2012   Procedure: CARPAL TUNNEL RELEASE;  Surgeon: Dominica Severin, MD;  Location: [facility];  Service: Orthopedics;  Laterality: Right;   TUBAL LIGATION  01/27/2011   laparoscopic   UPPER GASTROINTESTINAL ENDOSCOPY     Family History:  Family History  Problem Relation Age of Onset   Breast cancer Paternal Grandmother    Breast cancer Maternal Aunt    Diabetes Maternal Uncle     Social History:  Social History   Socioeconomic History   Marital status: Single    Spouse name: Not on file   Number of children: 4   Years of education: Not on file   Highest education level: Not on file  Occupational History   Not on file  Tobacco Use   Smoking status: Never   Smokeless tobacco: Never  Vaping Use   Vaping status: Never Used  Substance and Sexual Activity   Alcohol use: No   Drug use: No   Sexual activity: Not Currently    Birth control/protection: Surgical, I.U.D.  Other Topics Concern   Not on file  Social History Narrative   Not on file   Social Drivers of Health   Financial Resource Strain: Not on file  Food Insecurity: Not on file  Transportation Needs: Not on file  Physical Activity: Not on file  Stress: Not on file  Social Connections: Unknown (03/11/2022)   Received from [facility]   Social Network    Social Network: Not on file    Allergies:  Allergies  Allergen Reactions   Aspirin Other (See Comments)    NOSEBLEEDS    Current Medications: Current Outpatient Medications  Medication Sig Dispense Refill   propranolol (INDERAL) 10 MG tablet Take 1 tablet (10 mg total) by mouth 2 (two) times daily as needed. 60 tablet 1   amLODipine (NORVASC) 5 MG tablet Take 5 mg by mouth daily.     [START ON 11/24/2023] amphetamine-dextroamphetamine (ADDERALL) 10 MG tablet Take 1 tablet (10 mg total) by mouth 2 (two) times daily with a meal. 60 tablet 0   amphetamine-dextroamphetamine  (ADDERALL) 10 MG tablet Take 1 tablet (10 mg total) by mouth 2 (two) times daily with a meal. 60 tablet 0   omeprazole (PRILOSEC) 40 MG capsule Take 40 mg by mouth daily.     rizatriptan (MAXALT) 5 MG tablet Take 5 mg by mouth as needed for migraine.     vortioxetine HBr (TRINTELLIX) 10 MG TABS tablet Take 1.5 tablets (15 mg total) by mouth daily. 135 tablet 0   No current facility-administered medications for this visit.     Psychiatric Specialty Exam: Review of Systems  There were no vitals taken for this visit.There is no height or weight on file to calculate BMI.  General Appearance: Well Groomed  Eye Contact:  Good  Speech:  Clear and Coherent and Normal Rate  Volume:  Normal  Mood:  Anxious and Euthymic  Affect:  Congruent  Thought  Process:  Coherent and Goal Directed  Orientation:  Full (Time, Place, and Person)  Thought Content: Logical   Suicidal Thoughts:  No  Homicidal Thoughts:  No  Memory:  Immediate;   Good  Judgement:  Good  Insight:  Fair  Psychomotor Activity:  Normal  Concentration:  Concentration: Good  Recall:  Good  Fund of Knowledge: Good  Language: Good  Akathisia:  NA    AIMS (if indicated): not done  Assets:  Communication Skills Desire for Improvement Housing Resilience Transportation Vocational/Educational  ADL's:  Intact  Cognition: WNL  Sleep:  Good   Metabolic Disorder Labs: Lab Results  Component Value Date   HGBA1C 5.6 12/06/2018   MPG 114.02 12/06/2018   No results found for: "PROLACTIN" Lab Results  Component Value Date   CHOL 147 12/06/2018   TRIG 154 (H) 12/06/2018   HDL 44 12/06/2018   CHOLHDL 3.3 12/06/2018   VLDL 31 12/06/2018   LDLCALC 72 12/06/2018   No results found for: "TSH"  Therapeutic Level Labs: No results found for: "LITHIUM" No results found for: "VALPROATE" No results found for: "CBMZ"   Screenings: AIMS    Flowsheet Row Admission (Discharged) [facility]  AIMS Total Score 0      AUDIT    Flowsheet Row Admission (Discharged) from [facility]  Alcohol Use Disorder Identification Test Final Score (AUDIT) 0      Flowsheet Row Admission (Discharged) from [facility]  C-SSRS RISK CATEGORY Low Risk       Collaboration of Care: Collaboration of Care: Medication Management AEB medication prescription   Patient/Guardian was advised Release of Information must be obtained prior to any record release in order to collaborate their care with an outside provider. Patient/Guardian was advised if they have not already done so to contact the registration department to sign all necessary forms in order for [place] to release information regarding their care.   Consent: Patient/Guardian gives verbal consent for treatment and assignment of benefits for services provided during this visit. Patient/Guardian expressed understanding and agreed to proceed.    Stasia Cavalier, MD 10/27/2023, 8:20 AM   Virtual Visit via Video Note  I connected with Elenore Paddy on 10/27/23 at  8:00 AM EST by a video enabled telemedicine application and verified that I am speaking with the correct person using two identifiers.  Location: Patient: In her car Provider: Home Office   I discussed the limitations of evaluation and management by telemedicine and the availability of in person appointments. The patient expressed understanding and agreed to proceed.   I discussed the assessment and treatment plan with the patient. The patient was provided an opportunity to ask questions and all were answered. The patient agreed with the plan and demonstrated an understanding of the instructions.   The patient was advised to call back or seek an in-person evaluation if the symptoms worsen or if the condition fails to improve as  anticipated.  I provided 20 minutes of non-face-to-face time during this encounter.   Stasia Cavalier, MD

## 2023-10-25 ENCOUNTER — Other Ambulatory Visit: Payer: Self-pay | Admitting: Medical Genetics

## 2023-10-27 ENCOUNTER — Telehealth (HOSPITAL_BASED_OUTPATIENT_CLINIC_OR_DEPARTMENT_OTHER): Payer: BC Managed Care – PPO | Admitting: Psychiatry

## 2023-10-27 ENCOUNTER — Encounter (HOSPITAL_COMMUNITY): Payer: Self-pay | Admitting: Psychiatry

## 2023-10-27 DIAGNOSIS — G47419 Narcolepsy without cataplexy: Secondary | ICD-10-CM

## 2023-10-27 DIAGNOSIS — F3341 Major depressive disorder, recurrent, in partial remission: Secondary | ICD-10-CM

## 2023-10-27 MED ORDER — PROPRANOLOL HCL 10 MG PO TABS: 10.0000 mg | ORAL_TABLET | Freq: Two times a day (BID) | ORAL | 1 refills | Status: DC | PRN

## 2023-10-27 MED ORDER — AMPHETAMINE-DEXTROAMPHETAMINE 10 MG PO TABS: 10.0000 mg | ORAL_TABLET | Freq: Two times a day (BID) | ORAL | 0 refills | Status: DC

## 2023-10-27 MED ORDER — VORTIOXETINE HBR 10 MG PO TABS: 15.0000 mg | ORAL_TABLET | Freq: Every day | ORAL | 0 refills | Status: DC

## 2023-11-24 ENCOUNTER — Other Ambulatory Visit (HOSPITAL_COMMUNITY): Payer: Self-pay | Attending: Medical Genetics

## 2023-12-18 NOTE — Progress Notes (Signed)
BH MD/PA/NP OP Progress Note  12/22/2023 8:48 AM Tara Vega  MRN:  [id number]  Visit Diagnosis:    ICD-10-CM   1. Recurrent major depressive disorder, in partial remission (HCC)  F33.41 vortioxetine HBr (TRINTELLIX) 10 MG TABS tablet    propranolol (INDERAL) 10 MG tablet    amphetamine-dextroamphetamine (ADDERALL) 10 MG tablet    amphetamine-dextroamphetamine (ADDERALL) 10 MG tablet    2. Primary narcolepsy without cataplexy  G47.419 vortioxetine HBr (TRINTELLIX) 10 MG TABS tablet    amphetamine-dextroamphetamine (ADDERALL) 10 MG tablet    amphetamine-dextroamphetamine (ADDERALL) 10 MG tablet    amphetamine-dextroamphetamine (ADDERALL) 10 MG tablet       Assessment: Tara Vega is a 43 y.o. female with a history of MDD, GAD, and narcolepsy who presented to [facility] for initial evaluation on 02/09/2023 following transfer from Tara Vega.  Tara Vega presents for follow-up evaluation. Today, 12/22/23, patient reports that her mood and anxiety levels have improved.  She had significant improvement from the propranolol and financial stressors have improved to a degree after her mother moved in.  Her mother living with her has created some other psychosocial stressors though not to the degree Tara Vega had been expecting initially.  Currently she feels like being in the home is more beneficial than negative.  We will continue on her current regimen and follow-up in 3 months.  Patient will keep provider updated about potential change in insurance.  Plan: - Continue Adderall 10 mg BID for history of narcolepsy  - Continue Trintellix 5 mg QD and 10 mg nightly for depression. - Start propranolol 10 mg BID prn for anxiety - Continue with therapy every 3-4 weeks - Crisis resources reviewed - Follow up in 2 months  Chief Complaint:  Chief Complaint  Patient presents with   Follow-up   HPI: Tara Vega presents reporting that she is doing ok.  Things have  improved in the interim and she has noticed significant benefit from the propranolol.  Patient had initially been taking it twice a day but as her stress levels decreased she started taking it at night only.  In the past 2 weeks she has only needed the morning dose 1 time.  She denies any adverse side effects from the medication.  When she is feeling more anxious she finds that quickly resolves after taking propranolol.  In the interim her mom moved in with her, which is going a bit better then she was expecting. Her mother has an alcohol use disorder and had been in a relationship with another alcoholic who had increased his drinking substantially. Since moving in her mom has cut back her own alcohol use and been helping out around the house.  Her mother also does contribute financially which has alleviated some of Tara Vega's stress.  Her daughter has also been happy with her grandmother being in the house.  Tara Vega notes that she is considering a new job opportunity that is more financially rewarding.  The 1 concern she has about the switch however is how would impact her insurance as she feels very stable on her current medication regimen.  She is worried that the new insurance might not cover the Trintellix which would be a deal breaker for her.  Provided some information about insurance plans and types of insurance taken by this provider.  Past Psychiatric History: History of depression, anxiety, narcolepsy by history. History of a psychiatric admission in 2020 for worsening depression. Had followed with Dr. Hinton Dyer before  transferring to Tara Vega in the past.   Past Medical History:  Past Medical History:  Diagnosis Date   Carpal tunnel syndrome, right 10/07/2012   Depression    Headache(784.0)    tension; migraines 1-2 x/month   Reynolds syndrome [facility])     Past Surgical History:  Procedure Laterality Date   ANKLE FRACTURE SURGERY     APPENDECTOMY  02/26/2008   laparoscopic   CARPAL TUNNEL  RELEASE  10/11/2012   Procedure: CARPAL TUNNEL RELEASE;  Surgeon: Dominica Severin, MD;  Location: [facility];  Service: Orthopedics;  Laterality: Right;   TUBAL LIGATION  01/27/2011   laparoscopic   UPPER GASTROINTESTINAL ENDOSCOPY     Family History:  Family History  Problem Relation Age of Onset   Breast cancer Paternal Grandmother    Breast cancer Maternal Aunt    Diabetes Maternal Uncle     Social History:  Social History   Socioeconomic History   Marital status: Single    Spouse name: Not on file   Number of children: 4   Years of education: Not on file   Highest education level: Not on file  Occupational History   Not on file  Tobacco Use   Smoking status: Never   Smokeless tobacco: Never  Vaping Use   Vaping status: Never Used  Substance and Sexual Activity   Alcohol use: No   Drug use: No   Sexual activity: Not Currently    Birth control/protection: Surgical, I.U.D.  Other Topics Concern   Not on file  Social History Narrative   Not on file   Social Drivers of Health   Financial Resource Strain: Not on file  Food Insecurity: Not on file  Transportation Needs: Not on file  Physical Activity: Not on file  Stress: Not on file  Social Connections: Unknown (03/11/2022)   [facility]   Social Network    Social Network: Not on file    Allergies:  Allergies  Allergen Reactions   Aspirin Other (See Comments)    NOSEBLEEDS    Current Medications: Current Outpatient Medications  Medication Sig Dispense Refill   [START ON 02/19/2024] amphetamine-dextroamphetamine (ADDERALL) 10 MG tablet Take 1 tablet (10 mg total) by mouth daily. 30 tablet 0   amLODipine (NORVASC) 5 MG tablet Take 5 mg by mouth daily.     [START ON 01/19/2024] amphetamine-dextroamphetamine (ADDERALL) 10 MG tablet Take 1 tablet (10 mg total) by mouth 2 (two) times daily with a meal. 60 tablet 0   amphetamine-dextroamphetamine (ADDERALL) 10 MG tablet  Take 1 tablet (10 mg total) by mouth 2 (two) times daily with a meal. 60 tablet 0   omeprazole (PRILOSEC) 40 MG capsule Take 40 mg by mouth daily.     propranolol (INDERAL) 10 MG tablet Take 1 tablet (10 mg total) by mouth 2 (two) times daily as needed. 180 tablet 1   rizatriptan (MAXALT) 5 MG tablet Take 5 mg by mouth as needed for migraine.     vortioxetine HBr (TRINTELLIX) 10 MG TABS tablet Take 1.5 tablets (15 mg total) by mouth daily. 135 tablet 0   No current facility-administered medications for this visit.     Psychiatric Specialty Exam: Review of Systems  There were no vitals taken for this visit.There is no height or weight on file to calculate BMI.  General Appearance: Well Groomed  Eye Contact:  Good  Speech:  Clear and Coherent and Normal Rate  Volume:  Normal  Mood:  Euthymic  Affect:  Congruent  Thought Process:  Coherent and Goal Directed  Orientation:  Full (Time, Place, and Person)  Thought Content: Logical   Suicidal Thoughts:  No  Homicidal Thoughts:  No  Memory:  Immediate;   Good  Judgement:  Good  Insight:  Fair  Psychomotor Activity:  Normal  Concentration:  Concentration: Good  Recall:  Good  Fund of Knowledge: Good  Language: Good  Akathisia:  NA    AIMS (if indicated): not done  Assets:  Communication Skills Desire for Improvement Housing Resilience Transportation Vocational/Educational  ADL's:  Intact  Cognition: WNL  Sleep:  Good   Metabolic Disorder Labs: Lab Results  Component Value Date   HGBA1C 5.6 12/06/2018   MPG 114.02 12/06/2018   No results found for: "PROLACTIN" Lab Results  Component Value Date   CHOL 147 12/06/2018   TRIG 154 (H) 12/06/2018   HDL 44 12/06/2018   CHOLHDL 3.3 12/06/2018   VLDL 31 12/06/2018   LDLCALC 72 12/06/2018   No results found for: "TSH"  Therapeutic Level Labs: No results found for: "LITHIUM" No results found for: "VALPROATE" No results found for: "CBMZ"   Screenings: AIMS     Flowsheet Row Admission (Discharged) from [facility]  AIMS Total Score 0      AUDIT    Flowsheet Row Admission (Discharged) from [facility]  Alcohol Use Disorder Identification Test Final Score (AUDIT) 0      Flowsheet Row Admission (Discharged) from [facility]  C-SSRS RISK CATEGORY Low Risk       Collaboration of Care: Collaboration of Care: Medication Management AEB medication prescription   Patient/Guardian was advised Release of Information must be obtained prior to any record release in order to collaborate their care with an outside provider. Patient/Guardian was advised if they have not already done so to contact the registration department to sign all necessary forms in order for [place] to release information regarding their care.   Consent: Patient/Guardian gives verbal consent for treatment and assignment of benefits for services provided during this visit. Patient/Guardian expressed understanding and agreed to proceed.    Stasia Cavalier, MD 12/22/2023, 8:48 AM   Virtual Visit via Video Note  I connected with Elenore Paddy on 12/22/23 at  8:30 AM EST by a video enabled telemedicine application and verified that I am speaking with the correct person using two identifiers.  Location: Patient: In her car Provider: Home Office   I discussed the limitations of evaluation and management by telemedicine and the availability of in person appointments. The patient expressed understanding and agreed to proceed.   I discussed the assessment and treatment plan with the patient. The patient was provided an opportunity to ask questions and all were answered. The patient agreed with the plan and demonstrated an understanding of the instructions.   The patient was advised to call back or seek an  in-person evaluation if the symptoms worsen or if the condition fails to improve as anticipated.  I provided 20 minutes of non-face-to-face time during this encounter.   Stasia Cavalier, MD

## 2023-12-22 ENCOUNTER — Telehealth (HOSPITAL_COMMUNITY): Payer: BC Managed Care – PPO | Admitting: Psychiatry

## 2023-12-22 ENCOUNTER — Encounter (HOSPITAL_COMMUNITY): Payer: Self-pay | Admitting: Psychiatry

## 2023-12-22 DIAGNOSIS — F3341 Major depressive disorder, recurrent, in partial remission: Secondary | ICD-10-CM

## 2023-12-22 DIAGNOSIS — G47419 Narcolepsy without cataplexy: Secondary | ICD-10-CM | POA: Diagnosis not present

## 2023-12-22 MED ORDER — AMPHETAMINE-DEXTROAMPHETAMINE 10 MG PO TABS: 10.0000 mg | ORAL_TABLET | Freq: Every day | ORAL | 0 refills | Status: DC

## 2023-12-22 MED ORDER — AMPHETAMINE-DEXTROAMPHETAMINE 10 MG PO TABS
10.0000 mg | ORAL_TABLET | Freq: Two times a day (BID) | ORAL | 0 refills | Status: DC
Start: 2023-12-22 — End: 2024-03-14

## 2023-12-22 MED ORDER — VORTIOXETINE HBR 10 MG PO TABS: 15.0000 mg | ORAL_TABLET | Freq: Every day | ORAL | 0 refills | Status: DC

## 2023-12-22 MED ORDER — PROPRANOLOL HCL 10 MG PO TABS: 10.0000 mg | ORAL_TABLET | Freq: Two times a day (BID) | ORAL | 1 refills | Status: DC | PRN

## 2023-12-22 MED ORDER — AMPHETAMINE-DEXTROAMPHETAMINE 10 MG PO TABS: 10.0000 mg | ORAL_TABLET | Freq: Two times a day (BID) | ORAL | 0 refills | Status: DC

## 2024-03-11 NOTE — Progress Notes (Unsigned)
 BH MD/PA/NP OP Progress Note  03/14/2024 11:27 AM [name]  MRN:  [id number]  Visit Diagnosis:    ICD-10-CM   1. Recurrent major depressive disorder, in partial remission (HCC)  F33.41 vortioxetine  HBr (TRINTELLIX ) 10 MG TABS tablet    propranolol  (INDERAL ) 10 MG tablet    amphetamine -dextroamphetamine  (ADDERALL) 10 MG tablet    amphetamine -dextroamphetamine  (ADDERALL) 10 MG tablet    2. Primary narcolepsy without cataplexy  G47.419 vortioxetine  HBr (TRINTELLIX ) 10 MG TABS tablet    amphetamine -dextroamphetamine  (ADDERALL) 10 MG tablet    amphetamine -dextroamphetamine  (ADDERALL) 10 MG tablet    amphetamine -dextroamphetamine  (ADDERALL) 10 MG tablet    DISCONTINUED: amphetamine -dextroamphetamine  (ADDERALL) 10 MG tablet      Assessment: Tara Vega is a 43 y.o. female with a history of MDD, GAD, and narcolepsy who presented to [facility] at [facility] for initial evaluation on 02/09/2023 following transfer from Dr. Remi Carina.  Jameson Mcburney Horvath presents for follow-up evaluation. Today, 03/14/24, patient reports that there has been an increase in anxiety and irritability in the interim secondary to interpersonal conflict with her mother.  That said the addition of propranolol  has been beneficial and has controlled this increase fairly well.  Patient is able to tell notable difference when she does not take the medication.  She denies any adverse side effects from the propranolol .  For now she feels like the symptoms are manageable with this medication regimen and continuing to work towards setting boundaries with her mother. We will continue on her current regimen and follow-up in 3 months.     Psychotherapeutic interventions were used during today's session. From 8:05 AM to 8:21 AM. Therapeutic interventions included empathic listening, supportive therapy, cognitive and behavioral therapy, motivational interviewing. Used supportive interviewing techniques to provide  emotional validation. Worked on cognitive reframing techniques and recommendations made for behavioral activation.  Improvement was evidenced by patient's participation and identified commitment to therapy goals.    Plan: - Continue Adderall 10 mg BID for history of narcolepsy  - Continue Trintellix  5 mg QD and 10 mg nightly for depression. - Continue propranolol  10 mg BID prn for anxiety - Continue with therapy every 3-4 weeks - Crisis resources reviewed - Follow up in 3 months  Chief Complaint:  Chief Complaint  Patient presents with   Follow-up   HPI: Tara Vega presents reporting that she has been getting by. Her mother still living with her, which has been pretty rough. It feels like she is raising another kid. [name] has to tell her to get up in the mornings, make sure she eats, takes her medicine, take her places, and clean up after her mother. This has been really difficult and is causing [name] Hopping and her to "bump heads". There is frustration that her mother seems to help out everyone else but does little to nothing around the house.  As for the financial contribution it is helpful but does not seem to outweigh the increased and worked at [employer] has.  We provided support around this and discussed the importance of setting boundaries.  Patient acknowledged this noting that she is starting to do better with setting boundaries with her and taking space when needed.  We also did discuss some resources in the area that her mother could look into that could potentially help with getting her own housing.  The other job opportunities she had talked about did not end up working out.  For now however her former employer is going alright and she will keep  an eye out for other options in the future.  Maronda thinks the medication is doing a great job. Has tolerated the propranolol . When she misses a dose she can tell a major difference in her anxiety and irritability symptoms, particularly with her mother.  With this  medication on board she feels like she can handle the increased anxiety and frustration that she has been experiencing.    Past Psychiatric History: History of depression, anxiety, narcolepsy by history. History of a psychiatric admission in 2020 for worsening depression. Had followed with Dr. Daryel Ensign before transferring to Dr. Remi Carina in the past.   Past Medical History:  Past Medical History:  Diagnosis Date   Carpal tunnel syndrome, right 10/07/2012   Depression    Headache(784.0)    tension; migraines 1-2 x/month   Reynolds syndrome [facility])     Past Surgical History:  Procedure Laterality Date   ANKLE FRACTURE SURGERY     APPENDECTOMY  02/26/2008   laparoscopic   CARPAL TUNNEL RELEASE  10/11/2012   Procedure: CARPAL TUNNEL RELEASE;  Surgeon: Ronn Cohn, MD;  Location: [facility];  Service: Orthopedics;  Laterality: Right;   TUBAL LIGATION  01/27/2011   laparoscopic   UPPER GASTROINTESTINAL ENDOSCOPY     Family History:  Family History  Problem Relation Age of Onset   Breast cancer Paternal Grandmother    Breast cancer Maternal Aunt    Diabetes Maternal Uncle     Social History:  Social History   Socioeconomic History   Marital status: Single    Spouse name: Not on file   Number of children: 4   Years of education: Not on file   Highest education level: Not on file  Occupational History   Not on file  Tobacco Use   Smoking status: Never   Smokeless tobacco: Never  Vaping Use   Vaping status: Never Used  Substance and Sexual Activity   Alcohol use: No   Drug use: No   Sexual activity: Not Currently    Birth control/protection: Surgical, I.U.D.  Other Topics Concern   Not on file  Social History Narrative   Not on file   Social Drivers of Health   Financial Resource Strain: Not on file  Food Insecurity: Not on file  Transportation Needs: Not on file  Physical Activity: Not on file  Stress: Not on file  Social Connections: Unknown  (03/11/2022)   Received from [facility], [facility]   Social Network    Social Network: Not on file    Allergies:  Allergies  Allergen Reactions   Aspirin Other (See Comments)    NOSEBLEEDS    Current Medications: Current Outpatient Medications  Medication Sig Dispense Refill   amLODipine (NORVASC) 5 MG tablet Take 5 mg by mouth daily.     [START ON 03/18/2024] amphetamine -dextroamphetamine  (ADDERALL) 10 MG tablet Take 1 tablet (10 mg total) by mouth 2 (two) times daily with a meal. 60 tablet 0   [START ON 05/17/2024] amphetamine -dextroamphetamine  (ADDERALL) 10 MG tablet Take 1 tablet (10 mg total) by mouth 2 (two) times daily with a meal. 60 tablet 0   [START ON 04/18/2024] amphetamine -dextroamphetamine  (ADDERALL) 10 MG tablet Take 1 tablet (10 mg total) by mouth 2 (two) times daily. 60 tablet 0   omeprazole (PRILOSEC) 40 MG capsule Take 40 mg by mouth daily.     propranolol  (INDERAL ) 10 MG tablet Take 1 tablet (10 mg total) by mouth 2 (two) times daily as needed. 180 tablet 1  rizatriptan (MAXALT) 5 MG tablet Take 5 mg by mouth as needed for migraine.     vortioxetine  HBr (TRINTELLIX ) 10 MG TABS tablet Take 1.5 tablets (15 mg total) by mouth daily. 135 tablet 0   No current facility-administered medications for this visit.     Psychiatric Specialty Exam: Review of Systems  There were no vitals taken for this visit.There is no height or weight on file to calculate BMI.  General Appearance: Well Groomed  Eye Contact:  Good  Speech:  Clear and Coherent and Normal Rate  Volume:  Normal  Mood:  Euthymic and increase in anxiety and irritability managed by propranolol   Affect:  Congruent  Thought Process:  Coherent and Goal Directed  Orientation:  Full (Time, Place, and Person)  Thought Content: Logical   Suicidal Thoughts:  No  Homicidal Thoughts:  No  Memory:  Immediate;   Good  Judgement:  Good  Insight:  Fair  Psychomotor Activity:  Normal  Concentration:   Concentration: Good  Recall:  Good  Fund of Knowledge: Good  Language: Good  Akathisia:  NA    AIMS (if indicated): not done  Assets:  Communication Skills Desire for Improvement Housing Resilience Transportation Vocational/Educational  ADL's:  Intact  Cognition: WNL  Sleep:  Good   Metabolic Disorder Labs: Lab Results  Component Value Date   HGBA1C 5.6 12/06/2018   MPG 114.02 12/06/2018   No results found for: "PROLACTIN" Lab Results  Component Value Date   CHOL 147 12/06/2018   TRIG 154 (H) 12/06/2018   HDL 44 12/06/2018   CHOLHDL 3.3 12/06/2018   VLDL 31 12/06/2018   LDLCALC 72 12/06/2018   No results found for: "TSH"  Therapeutic Level Labs: No results found for: "LITHIUM" No results found for: "VALPROATE" No results found for: "CBMZ"   Screenings: AIMS    Flowsheet Row Admission (Discharged) [facility]  AIMS Total Score 0      AUDIT    Flowsheet Row Admission (Discharged) from [facility]  Alcohol Use Disorder Identification Test Final Score (AUDIT) 0      Flowsheet Row Admission (Discharged) from [facility]  C-SSRS RISK CATEGORY Low Risk       Collaboration of Care: Collaboration of Care: Medication Management AEB medication prescription   Patient/Guardian was advised Release of Information must be obtained prior to any record release in order to collaborate their care with an outside provider. Patient/Guardian was advised if they have not already done so to contact the registration department to sign all necessary forms in order for us  to release information regarding their care.   Consent: Patient/Guardian gives verbal consent for treatment and assignment of benefits for services provided during this visit. Patient/Guardian expressed understanding and  agreed to proceed.    Yves Herb, MD 03/14/2024, 11:27 AM   Virtual Visit via Video Note  I connected with Edyth Grana on 03/14/24 at  8:00 AM EDT by a video enabled telemedicine application and verified that I am speaking with the correct person using two identifiers.  Location: Patient: In her car Provider: Home Office   I discussed the limitations of evaluation and management by telemedicine and the availability of in person appointments. The patient expressed understanding and agreed to proceed.   I discussed the assessment and treatment plan with the patient. The patient was  provided an opportunity to ask questions and all were answered. The patient agreed with the plan and demonstrated an understanding of the instructions.   The patient was advised to call back or seek an in-person evaluation if the symptoms worsen or if the condition fails to improve as anticipated.  I provided 20 minutes of non-face-to-face time during this encounter.   Yves Herb, MD

## 2024-03-14 ENCOUNTER — Telehealth (HOSPITAL_COMMUNITY): Payer: BC Managed Care – PPO | Admitting: Psychiatry

## 2024-03-14 ENCOUNTER — Telehealth (HOSPITAL_COMMUNITY): Payer: Self-pay | Admitting: *Deleted

## 2024-03-14 ENCOUNTER — Encounter (HOSPITAL_COMMUNITY): Payer: Self-pay | Admitting: Psychiatry

## 2024-03-14 DIAGNOSIS — G47419 Narcolepsy without cataplexy: Secondary | ICD-10-CM

## 2024-03-14 DIAGNOSIS — F3341 Major depressive disorder, recurrent, in partial remission: Secondary | ICD-10-CM

## 2024-03-14 MED ORDER — AMPHETAMINE-DEXTROAMPHETAMINE 10 MG PO TABS: 10.0000 mg | ORAL_TABLET | Freq: Every day | ORAL | 0 refills | Status: DC

## 2024-03-14 MED ORDER — VORTIOXETINE HBR 10 MG PO TABS: 15.0000 mg | ORAL_TABLET | Freq: Every day | ORAL | 0 refills | Status: DC

## 2024-03-14 MED ORDER — AMPHETAMINE-DEXTROAMPHETAMINE 10 MG PO TABS: 10.0000 mg | ORAL_TABLET | Freq: Two times a day (BID) | ORAL | 0 refills | Status: DC

## 2024-03-14 MED ORDER — PROPRANOLOL HCL 10 MG PO TABS: 10.0000 mg | ORAL_TABLET | Freq: Two times a day (BID) | ORAL | 1 refills | Status: DC | PRN

## 2024-03-14 NOTE — Addendum Note (Signed)
 Addended by: Donnelly Gainer on: 03/14/2024 11:27 AM   Modules accepted: Orders

## 2024-03-14 NOTE — Telephone Encounter (Signed)
 Received t/c from pt's pharmacy, [facility], asking for clarification on the prescription dated 04/18/24. That script has SIG to take 1 every day while the other two scripts are BID. Please review.

## 2024-05-30 ENCOUNTER — Other Ambulatory Visit (HOSPITAL_COMMUNITY): Payer: Self-pay

## 2024-05-30 DIAGNOSIS — G47419 Narcolepsy without cataplexy: Secondary | ICD-10-CM

## 2024-05-30 DIAGNOSIS — F3341 Major depressive disorder, recurrent, in partial remission: Secondary | ICD-10-CM

## 2024-05-30 MED ORDER — VORTIOXETINE HBR 10 MG PO TABS: 15.0000 mg | ORAL_TABLET | Freq: Every day | ORAL | Status: DC

## 2024-06-05 NOTE — Progress Notes (Unsigned)
 BH MD/PA/NP OP Progress Note  06/06/2024 8:55 AM Tara Vega  MRN:  [id number]  Visit Diagnosis:    ICD-10-CM   1. Primary narcolepsy without cataplexy  G47.419 amphetamine -dextroamphetamine  (ADDERALL) 10 MG tablet    amphetamine -dextroamphetamine  (ADDERALL) 10 MG tablet    amphetamine -dextroamphetamine  (ADDERALL) 10 MG tablet    vortioxetine  HBr (TRINTELLIX ) 10 MG TABS tablet    2. Recurrent major depressive disorder, in partial remission (HCC)  F33.41 amphetamine -dextroamphetamine  (ADDERALL) 10 MG tablet    amphetamine -dextroamphetamine  (ADDERALL) 10 MG tablet    vortioxetine  HBr (TRINTELLIX ) 10 MG TABS tablet      Assessment: Tara Vega is a 43 y.o. female with a history of MDD, GAD, and narcolepsy who presented to [facility] at [facility] for initial evaluation on 02/09/2023 following transfer from Tara Vega.  Tara Vega presents for follow-up evaluation. Today, 06/06/24, patient reports that mood, anxiety, and irritability have been well controlled over the past few months.  There was a few day period of increased anxiety and irritability that this is secondary to running out of Trintellix .  On review she should have had enough to get until August 8 and it appears that the shortage was due to a pharmacy error.  Patient had increased the propranolol  to twice daily during the days without Trintellix  and found benefit.  Pharmacy did correct the error and gave her enough pills to last until this appointment.  Patient denies any medication side effects and will continue on her current regimen and follow up in 3 months.  Plan: - Continue Adderall 10 mg BID for history of narcolepsy  - Continue Trintellix  5 mg QD and 10 mg nightly for depression. - Continue propranolol  10 mg BID prn for anxiety - Continue with therapy every 3-4 weeks - Crisis resources reviewed - Follow up in 3 months  Chief Complaint:  Chief Complaint  Patient presents with   Follow-up    HPI: Tara Vega presents reporting that things have been alright the last few months. There was a difficult couple of days where she was short on the Trintellix  for a couple of days. On review she believes the pharmacy may have not given enough.  The 2 days without the Trintellix  patient did have increased anxiety and mood lability.  She had taken the propranolol  twice a day during that time which helped.  Patient reached out to the pharmacy and they were able to provide enough medication to get to this appointment.  Outside of those 2 days patient reports he is only been taking the propranolol  at night with good effect.  Mood wise things have been pretty good.  Things can still get stressful at home with her mother there however patient reports her mother has been making an effort which makes things a bit easier.  Her kids are also doing well.  Patient denies any concerns or medication side effects at this time.   Past Psychiatric History: History of depression, anxiety, narcolepsy by history. History of a psychiatric admission in 2020 for worsening depression. Had followed with Tara Vega before transferring to Tara Vega in the past.   Past Medical History:  Past Medical History:  Diagnosis Date   Carpal tunnel syndrome, right 10/07/2012   Depression    Headache(784.0)    tension; migraines 1-2 x/month   Reynolds syndrome [facility])     Past Surgical History:  Procedure Laterality Date   ANKLE FRACTURE SURGERY     APPENDECTOMY  02/26/2008   laparoscopic  CARPAL TUNNEL RELEASE  10/11/2012   Procedure: CARPAL TUNNEL RELEASE;  Surgeon: Tara Mussel, MD;  Location: [facility];  Service: Orthopedics;  Laterality: Right;   TUBAL LIGATION  01/27/2011   laparoscopic   UPPER GASTROINTESTINAL ENDOSCOPY     Family History:  Family History  Problem Relation Age of Onset   Breast cancer Paternal Grandmother    Breast cancer Maternal Aunt    Diabetes Maternal Uncle      Social History:  Social History   Socioeconomic History   Marital status: Single    Spouse name: Not on file   Number of children: 4   Years of education: Not on file   Highest education level: Not on file  Occupational History   Not on file  Tobacco Use   Smoking status: Never   Smokeless tobacco: Never  Vaping Use   Vaping status: Never Used  Substance and Sexual Activity   Alcohol use: No   Drug use: No   Sexual activity: Not Currently    Birth control/protection: Surgical, I.U.D.  Other Topics Concern   Not on file  Social History Narrative   Not on file   Social Drivers of Health   Financial Resource Strain: Not on file  Food Insecurity: Not on file  Transportation Needs: Not on file  Physical Activity: Not on file  Stress: Not on file  Social Connections: Unknown (03/11/2022)   [facility]   Social Network    Social Network: Not on file    Allergies:  Allergies  Allergen Reactions   Aspirin Other (See Comments)    NOSEBLEEDS    Current Medications: Current Outpatient Medications  Medication Sig Dispense Refill   amLODipine (NORVASC) 5 MG tablet Take 5 mg by mouth daily.     [START ON 06/14/2024] amphetamine -dextroamphetamine  (ADDERALL) 10 MG tablet Take 1 tablet (10 mg total) by mouth 2 (two) times daily with a meal. 60 tablet 0   [START ON 07/15/2024] amphetamine -dextroamphetamine  (ADDERALL) 10 MG tablet Take 1 tablet (10 mg total) by mouth 2 (two) times daily. 60 tablet 0   [START ON 08/14/2024] amphetamine -dextroamphetamine  (ADDERALL) 10 MG tablet Take 1 tablet (10 mg total) by mouth 2 (two) times daily with a meal. 60 tablet 0   omeprazole (PRILOSEC) 40 MG capsule Take 40 mg by mouth daily.     propranolol  (INDERAL ) 10 MG tablet Take 1 tablet (10 mg total) by mouth 2 (two) times daily as needed. 180 tablet 1   rizatriptan (MAXALT) 5 MG tablet Take 5 mg by mouth as needed for migraine.     vortioxetine  HBr (TRINTELLIX ) 10 MG TABS tablet  Take 1.5 tablets (15 mg total) by mouth daily. 135 tablet 1   No current facility-administered medications for this visit.     Psychiatric Specialty Exam: Review of Systems  There were no vitals taken for this visit.There is no height or weight on file to calculate BMI.  General Appearance: Well Groomed  Eye Contact:  Good  Speech:  Clear and Coherent and Normal Rate  Volume:  Normal  Mood:  Euthymic  Affect:  Congruent  Thought Process:  Coherent and Goal Directed  Orientation:  Full (Time, Place, and Person)  Thought Content: Logical   Suicidal Thoughts:  No  Homicidal Thoughts:  No  Memory:  Immediate;   Good  Judgement:  Good  Insight:  Fair  Psychomotor Activity:  Normal  Concentration:  Concentration: Good  Recall:  Good  Fund of  Knowledge: Good  Language: Good  Akathisia:  NA    AIMS (if indicated): not done  Assets:  Communication Skills Desire for Improvement Housing Resilience Transportation Vocational/Educational  ADL's:  Intact  Cognition: WNL  Sleep:  Good   Metabolic Disorder Labs: Lab Results  Component Value Date   HGBA1C 5.6 12/06/2018   MPG 114.02 12/06/2018   No results found for: PROLACTIN Lab Results  Component Value Date   CHOL 147 12/06/2018   TRIG 154 (H) 12/06/2018   HDL 44 12/06/2018   CHOLHDL 3.3 12/06/2018   VLDL 31 12/06/2018   LDLCALC 72 12/06/2018   No results found for: TSH  Therapeutic Level Labs: No results found for: LITHIUM No results found for: VALPROATE No results found for: CBMZ   Screenings: AIMS    Flowsheet Row Admission (Discharged) [facility]  AIMS Total Score 0   AUDIT    Flowsheet Row Admission (Discharged) from [facility]  Alcohol Use Disorder Identification Test Final Score (AUDIT) 0   Flowsheet Row Admission (Discharged) from [facility]2018 [facility]  C-SSRS RISK CATEGORY Low Risk    Collaboration of Care: Collaboration of Care: Medication Management AEB medication prescription   Patient/Guardian was advised Release of Information must be obtained prior to any record release in order to collaborate their care with an outside provider. Patient/Guardian was advised if they have not already done so to contact the registration department to sign all necessary forms in order for us  to release information regarding their care.   Consent: Patient/Guardian gives verbal consent for treatment and assignment of benefits for services provided during this visit. Patient/Guardian expressed understanding and agreed to proceed.    Arvella CHRISTELLA Finder, MD 06/06/2024, 8:55 AM   Virtual Visit via Video Note  I connected with Tara Vega on 06/06/24 at  8:30 AM EDT by a video enabled telemedicine application and verified that I am speaking with the correct person using two identifiers.  Location: Patient: In her car Provider: Home Office   I discussed the limitations of evaluation and management by telemedicine and the availability of in person appointments. The patient expressed understanding and agreed to proceed.   I discussed the assessment and treatment plan with the patient. The patient was provided an opportunity to ask questions and all were answered. The patient agreed with the plan and demonstrated an understanding of the instructions.   The patient was advised to call back or seek an in-person evaluation if the symptoms worsen or if the condition fails to improve as anticipated.  I provided 20 minutes of non-face-to-face time during this encounter.   Arvella CHRISTELLA Finder, MD

## 2024-06-06 ENCOUNTER — Encounter (HOSPITAL_COMMUNITY): Payer: Self-pay | Admitting: Psychiatry

## 2024-06-06 ENCOUNTER — Telehealth (HOSPITAL_COMMUNITY): Admitting: Psychiatry

## 2024-06-06 DIAGNOSIS — G47419 Narcolepsy without cataplexy: Secondary | ICD-10-CM

## 2024-06-06 DIAGNOSIS — F329 Major depressive disorder, single episode, unspecified: Secondary | ICD-10-CM | POA: Diagnosis not present

## 2024-06-06 DIAGNOSIS — F3341 Major depressive disorder, recurrent, in partial remission: Secondary | ICD-10-CM

## 2024-06-06 MED ORDER — AMPHETAMINE-DEXTROAMPHETAMINE 10 MG PO TABS
10.0000 mg | ORAL_TABLET | Freq: Two times a day (BID) | ORAL | 0 refills | Status: DC
Start: 2024-07-15 — End: 2024-09-05

## 2024-06-06 MED ORDER — VORTIOXETINE HBR 10 MG PO TABS: 15.0000 mg | ORAL_TABLET | Freq: Every day | ORAL | 1 refills | Status: DC

## 2024-06-06 MED ORDER — AMPHETAMINE-DEXTROAMPHETAMINE 10 MG PO TABS: 10.0000 mg | ORAL_TABLET | Freq: Two times a day (BID) | ORAL | 0 refills | Status: DC

## 2024-06-13 ENCOUNTER — Telehealth (HOSPITAL_COMMUNITY): Admitting: Psychiatry

## 2024-08-01 ENCOUNTER — Other Ambulatory Visit (HOSPITAL_COMMUNITY): Payer: Self-pay | Admitting: Psychiatry

## 2024-08-01 DIAGNOSIS — F3341 Major depressive disorder, recurrent, in partial remission: Secondary | ICD-10-CM

## 2024-08-01 DIAGNOSIS — G47419 Narcolepsy without cataplexy: Secondary | ICD-10-CM

## 2024-08-28 ENCOUNTER — Other Ambulatory Visit: Payer: Self-pay | Admitting: Medical Genetics

## 2024-08-28 DIAGNOSIS — Z006 Encounter for examination for normal comparison and control in clinical research program: Secondary | ICD-10-CM

## 2024-09-02 NOTE — Progress Notes (Unsigned)
 BH MD/PA/NP OP Progress Note  09/05/2024 8:37 AM Tara Vega  MRN:  [id number]  Visit Diagnosis:    ICD-10-CM   1. Primary narcolepsy without cataplexy  G47.419 amphetamine -dextroamphetamine  (ADDERALL) 10 MG tablet    amphetamine -dextroamphetamine  (ADDERALL) 10 MG tablet    amphetamine -dextroamphetamine  (ADDERALL) 10 MG tablet    vortioxetine  HBr (TRINTELLIX ) 10 MG TABS tablet    2. Recurrent major depressive disorder, in partial remission  F33.41 amphetamine -dextroamphetamine  (ADDERALL) 10 MG tablet    amphetamine -dextroamphetamine  (ADDERALL) 10 MG tablet    vortioxetine  HBr (TRINTELLIX ) 10 MG TABS tablet       Assessment: Tara Vega is a 43 y.o. female with a history of MDD, GAD, and narcolepsy who presented to [facility] at [facility] for initial evaluation on 02/09/2023 following transfer from Dr. Rudy.  Tara Vega presents for follow-up evaluation. Today, 09/05/24, patient reports that mood and anxiety symptoms are stable and well-controlled.  Similarly there has been no concerns with narcolepsy recently.  She is taking her medications consistently and denies any adverse side effects.  Will continue on her current regimen and follow-up in 3 months.  Plan: - Continue Adderall 10 mg BID for history of narcolepsy  - Continue Trintellix  5 mg QD and 10 mg nightly for depression. - Continue propranolol  10 mg BID prn for anxiety - Continue with therapy every 3-4 weeks - Crisis resources reviewed - Follow up in 3 months  Chief Complaint:  Chief Complaint  Patient presents with   Follow-up   HPI: Tara Vega presents reporting that things have been going well the past 3 months.  Work has been keeping her busy and she got a slight heres which is nice.  At home things are going a little bit better too.  Her mom had needed an emergency surgery in the interim but it went well and she recovered quickly.  Since then she has been a lot better in the home compared to  the past.  Patient's daughter also return to school and is not doing home school this year.  This is going much better compared to the past and her daughter is joining the cheerleading team.  Patient denies any concerns with mood or anxiety symptoms and all have been stable.  Sleep is also been well-controlled with no concerns of narcolepsy.  Patient was started on metformin due to concern of prediabetes (5.9 A1c). She was started a week ago and is dealing with mild nausea that is manageable.  Past Psychiatric History: History of depression, anxiety, narcolepsy by history. History of a psychiatric admission in 2020 for worsening depression. Had followed with Dr. Lynnetta before transferring to Dr. Rudy in the past.   Past Medical History:  Past Medical History:  Diagnosis Date   Carpal tunnel syndrome, right 10/07/2012   Depression    Headache(784.0)    tension; migraines 1-2 x/month   Reynolds syndrome [facility])     Past Surgical History:  Procedure Laterality Date   ANKLE FRACTURE SURGERY     APPENDECTOMY  02/26/2008   laparoscopic   CARPAL TUNNEL RELEASE  10/11/2012   Procedure: CARPAL TUNNEL RELEASE;  Surgeon: Elsie Mussel, MD;  Location: [facility];  Service: Orthopedics;  Laterality: Right;   TUBAL LIGATION  01/27/2011   laparoscopic   UPPER GASTROINTESTINAL ENDOSCOPY     Family History:  Family History  Problem Relation Age of Onset   Breast cancer Paternal Grandmother    Breast cancer Maternal Aunt  Diabetes Maternal Uncle     Social History:  Social History   Socioeconomic History   Marital status: Single    Spouse name: Not on file   Number of children: 4   Years of education: Not on file   Highest education level: Not on file  Occupational History   Not on file  Tobacco Use   Smoking status: Never   Smokeless tobacco: Never  Vaping Use   Vaping status: Never Used  Substance and Sexual Activity   Alcohol use: No   Drug use: No   Sexual  activity: Not Currently    Birth control/protection: Surgical, I.U.D.  Other Topics Concern   Not on file  Social History Narrative   Not on file   Social Drivers of Health   Financial Resource Strain: Not on file  Food Insecurity: Not on file  Transportation Needs: Not on file  Physical Activity: Not on file  Stress: Not on file  Social Connections: Unknown (03/11/2022)   Received from [facility]   Social Network    Social Network: Not on file    Allergies:  Allergies  Allergen Reactions   Aspirin Other (See Comments)    NOSEBLEEDS    Current Medications: Current Outpatient Medications  Medication Sig Dispense Refill   metFORMIN (GLUCOPHAGE-XR) 500 MG 24 hr tablet Take 500 mg by mouth daily with supper.     amLODipine (NORVASC) 5 MG tablet Take 5 mg by mouth daily.     [START ON 09/11/2024] amphetamine -dextroamphetamine  (ADDERALL) 10 MG tablet Take 1 tablet (10 mg total) by mouth 2 (two) times daily. 60 tablet 0   [START ON 10/09/2024] amphetamine -dextroamphetamine  (ADDERALL) 10 MG tablet Take 1 tablet (10 mg total) by mouth 2 (two) times daily with a meal. 60 tablet 0   [START ON 11/11/2024] amphetamine -dextroamphetamine  (ADDERALL) 10 MG tablet Take 1 tablet (10 mg total) by mouth 2 (two) times daily with a meal. 60 tablet 0   omeprazole (PRILOSEC) 40 MG capsule Take 40 mg by mouth daily.     propranolol  (INDERAL ) 10 MG tablet Take 1 tablet (10 mg total) by mouth 2 (two) times daily as needed. 180 tablet 1   rizatriptan (MAXALT) 5 MG tablet Take 5 mg by mouth as needed for migraine.     vortioxetine  HBr (TRINTELLIX ) 10 MG TABS tablet Take 1.5 tablets (15 mg total) by mouth daily. 135 tablet 0   No current facility-administered medications for this visit.     Psychiatric Specialty Exam: Review of Systems  There were no vitals taken for this visit.There is no height or weight on file to calculate BMI.  General Appearance: Well Groomed  Eye Contact:  Good  Speech:  Clear  and Coherent and Normal Rate  Volume:  Normal  Mood:  Euthymic  Affect:  Congruent  Thought Process:  Coherent and Goal Directed  Orientation:  Full (Time, Place, and Person)  Thought Content: Logical   Suicidal Thoughts:  No  Homicidal Thoughts:  No  Memory:  Immediate;   Good  Judgement:  Good  Insight:  Fair  Psychomotor Activity:  Normal  Concentration:  Concentration: Good  Recall:  Good  Fund of Knowledge: Good  Language: Good  Akathisia:  NA    AIMS (if indicated): not done  Assets:  Communication Skills Desire for Improvement Housing Resilience Transportation Vocational/Educational  ADL's:  Intact  Cognition: WNL  Sleep:  Good   Metabolic Disorder Labs: Lab Results  Component Value Date   HGBA1C  5.6 12/06/2018   MPG 114.02 12/06/2018   No results found for: PROLACTIN Lab Results  Component Value Date   CHOL 147 12/06/2018   TRIG 154 (H) 12/06/2018   HDL 44 12/06/2018   CHOLHDL 3.3 12/06/2018   VLDL 31 12/06/2018   LDLCALC 72 12/06/2018   No results found for: TSH  Therapeutic Level Labs: No results found for: LITHIUM No results found for: VALPROATE No results found for: CBMZ   Screenings: AIMS    Flowsheet Row Admission (Discharged) [facility]  AIMS Total Score 0   AUDIT    Flowsheet Row Admission (Discharged) from [facility]  Alcohol Use Disorder Identification Test Final Score (AUDIT) 0   Flowsheet Row Admission (Discharged) from [facility]  C-SSRS RISK CATEGORY Low Risk    Collaboration of Care: Collaboration of Care: Medication Management AEB medication prescription   Patient/Guardian was advised Release of Information must be obtained prior to any record release in order to collaborate their care with an outside provider.  Patient/Guardian was advised if they have not already done so to contact the registration department to sign all necessary forms in order for us  to release information regarding their care.   Consent: Patient/Guardian gives verbal consent for treatment and assignment of benefits for services provided during this visit. Patient/Guardian expressed understanding and agreed to proceed.    Arvella CHRISTELLA Finder, MD 09/05/2024, 8:37 AM   Virtual Visit via Video Note  I connected with Tara Ohs on 09/05/24 at  8:30 AM EDT by a video enabled telemedicine application and verified that I am speaking with the correct person using two identifiers.  [place]: [place] Provider: Home Office   I discussed the limitations of evaluation and management by telemedicine and the availability of in person appointments. The patient expressed understanding and agreed to proceed.   I discussed the assessment and treatment plan with the patient. The patient was provided an opportunity to ask questions and all were answered. The patient agreed with the plan and demonstrated an understanding of the instructions.   The patient was advised to call back or seek an in-person evaluation if the symptoms worsen or if the condition fails to improve as anticipated.  I provided 5 minutes of non-face-to-face time during this encounter.   Arvella CHRISTELLA Finder, MD

## 2024-09-05 ENCOUNTER — Telehealth (HOSPITAL_COMMUNITY): Admitting: Psychiatry

## 2024-09-05 ENCOUNTER — Encounter (HOSPITAL_COMMUNITY): Payer: Self-pay | Admitting: Psychiatry

## 2024-09-05 DIAGNOSIS — G47419 Narcolepsy without cataplexy: Secondary | ICD-10-CM | POA: Diagnosis not present

## 2024-09-05 DIAGNOSIS — F3341 Major depressive disorder, recurrent, in partial remission: Secondary | ICD-10-CM | POA: Diagnosis not present

## 2024-09-05 MED ORDER — AMPHETAMINE-DEXTROAMPHETAMINE 10 MG PO TABS: 10.0000 mg | ORAL_TABLET | Freq: Two times a day (BID) | ORAL | 0 refills | Status: DC

## 2024-09-05 MED ORDER — VORTIOXETINE HBR 10 MG PO TABS: 15.0000 mg | ORAL_TABLET | Freq: Every day | ORAL | 0 refills | Status: DC

## 2024-11-25 NOTE — Progress Notes (Unsigned)
 BH MD/PA/NP OP Progress Note  11/28/2024 8:43 AM Tara Vega  MRN:  [id number]  Visit Diagnosis:    ICD-10-CM   1. Primary narcolepsy without cataplexy  G47.419 vortioxetine  HBr (TRINTELLIX ) 10 MG TABS tablet    amphetamine -dextroamphetamine  (ADDERALL) 10 MG tablet    amphetamine -dextroamphetamine  (ADDERALL) 10 MG tablet    amphetamine -dextroamphetamine  (ADDERALL) 10 MG tablet    2. Recurrent major depressive disorder, in partial remission  F33.41 vortioxetine  HBr (TRINTELLIX ) 10 MG TABS tablet    amphetamine -dextroamphetamine  (ADDERALL) 10 MG tablet    amphetamine -dextroamphetamine  (ADDERALL) 10 MG tablet    propranolol  (INDERAL ) 10 MG tablet      Assessment: Tara Vega is a 44 y.o. female with a history of MDD, GAD, and narcolepsy who presented to [facility] at [facility] for initial evaluation on 02/09/2023 following transfer from Dr. Rudy.  Ronal JULIANNA Loy presents for follow-up evaluation. Today, 11/28/24, patient has had stable  mood and anxiety symptoms in the interim.  Similarly there has been no concerns with narcolepsy recently. PCP has order repeat sleep study to rule out sleep apnea. She is taking her medications consistently and denies any adverse side effects.  Will continue on her current regimen and follow-up in 3 months.  Plan: - Continue Adderall 10 mg BID for history of narcolepsy  - Continue Trintellix  5 mg QD and 10 mg nightly for depression. - Continue propranolol  10 mg BID prn for anxiety - Continue with therapy every 3-4 weeks - Crisis resources reviewed - Follow up in 3 months  Chief Complaint:  Chief Complaint  Patient presents with   Follow-up   HPI: Tara Vega presents reporting that she is doing well and things are pretty stable. Things have been going well at home with her mom and the rest of the family.  She even had a good holiday which she was surprised by.  Patient reports that work is busy and unchanged but manageable.  She  denies feeling significant stress related to work.  Patient reports that moods been stable and narcolepsy has been well-controlled.  She is taking her medications consistently and denies adverse side effects.  She takes the propranolol  at night with good effect and it helps her fall asleep.  Of note PCP has ordered a sleep study to rule out sleep apnea.  Past Psychiatric History: History of depression, anxiety, narcolepsy by history. History of a psychiatric admission in 2020 for worsening depression. Had followed with Dr. Lynnetta before transferring to Dr. Rudy in the past.   Past Medical History:  Past Medical History:  Diagnosis Date   Carpal tunnel syndrome, right 10/07/2012   Depression    Headache(784.0)    tension; migraines 1-2 x/month   Reynolds syndrome [facility])     Past Surgical History:  Procedure Laterality Date   ANKLE FRACTURE SURGERY     APPENDECTOMY  02/26/2008   laparoscopic   CARPAL TUNNEL RELEASE  10/11/2012   Procedure: CARPAL TUNNEL RELEASE;  Surgeon: Elsie Mussel, MD;  Location: [facility];  Service: Orthopedics;  Laterality: Right;   TUBAL LIGATION  01/27/2011   laparoscopic   UPPER GASTROINTESTINAL ENDOSCOPY     Family History:  Family History  Problem Relation Age of Onset   Breast cancer Paternal Grandmother    Breast cancer Maternal Aunt    Diabetes Maternal Uncle     Social History:  Social History   Socioeconomic History   Marital status: Single    Spouse name: Not on file  Number of children: 4   Years of education: Not on file   Highest education level: Not on file  Occupational History   Not on file  Tobacco Use   Smoking status: Never   Smokeless tobacco: Never  Vaping Use   Vaping status: Never Used  Substance and Sexual Activity   Alcohol use: No   Drug use: No   Sexual activity: Not Currently    Birth control/protection: Surgical, I.U.D.  Other Topics Concern   Not on file  Social History Narrative    Not on file   Social Drivers of Health   Tobacco Use: Low Risk (11/28/2024)   Patient History    Smoking Tobacco Use: Never    Smokeless Tobacco Use: Never    Passive Exposure: Not on file  Financial Resource Strain: Not on file  Food Insecurity: Not on file  Transportation Needs: Not on file  Physical Activity: Not on file  Stress: Not on file  Social Connections: Not on file  Depression (EYV7-0): Not on file  Alcohol Screen: Not on file  Housing: Not on file  Utilities: Not on file  Health Literacy: Not on file    Allergies:  Allergies  Allergen Reactions   Aspirin Other (See Comments)    NOSEBLEEDS    Current Medications: Current Outpatient Medications  Medication Sig Dispense Refill   amLODipine (NORVASC) 5 MG tablet Take 5 mg by mouth daily.     [START ON 12/10/2024] amphetamine -dextroamphetamine  (ADDERALL) 10 MG tablet Take 1 tablet (10 mg total) by mouth 2 (two) times daily with a meal. 60 tablet 0   [START ON 02/06/2025] amphetamine -dextroamphetamine  (ADDERALL) 10 MG tablet Take 1 tablet (10 mg total) by mouth 2 (two) times daily with a meal. 60 tablet 0   [START ON 01/08/2025] amphetamine -dextroamphetamine  (ADDERALL) 10 MG tablet Take 1 tablet (10 mg total) by mouth 2 (two) times daily. 60 tablet 0   metFORMIN (GLUCOPHAGE-XR) 500 MG 24 hr tablet Take 500 mg by mouth daily with supper.     omeprazole (PRILOSEC) 40 MG capsule Take 40 mg by mouth daily.     propranolol  (INDERAL ) 10 MG tablet Take 1 tablet (10 mg total) by mouth 2 (two) times daily as needed. 180 tablet 1   rizatriptan (MAXALT) 5 MG tablet Take 5 mg by mouth as needed for migraine.     vortioxetine  HBr (TRINTELLIX ) 10 MG TABS tablet Take 1.5 tablets (15 mg total) by mouth daily. 135 tablet 0   No current facility-administered medications for this visit.     Psychiatric Specialty Exam: Review of Systems  There were no vitals taken for this visit.There is no height or weight on file to calculate BMI.   General Appearance: Well Groomed  Eye Contact:  Good  Speech:  Clear and Coherent and Normal Rate  Volume:  Normal  Mood:  Euthymic  Affect:  Congruent  Thought Process:  Coherent and Goal Directed  Orientation:  Full (Time, Place, and Person)  Thought Content: Logical   Suicidal Thoughts:  No  Homicidal Thoughts:  No  Memory:  Immediate;   Good  Judgement:  Good  Insight:  Fair  Psychomotor Activity:  Normal  Concentration:  Concentration: Good  Recall:  Good  Fund of Knowledge: Good  Language: Good  Akathisia:  NA    AIMS (if indicated): not done  Assets:  Communication Skills Desire for Improvement Housing Resilience Transportation Vocational/Educational  ADL's:  Intact  Cognition: WNL  Sleep:  Good  Metabolic Disorder Labs: Lab Results  Component Value Date   HGBA1C 5.6 12/06/2018   MPG 114.02 12/06/2018   No results found for: PROLACTIN Lab Results  Component Value Date   CHOL 147 12/06/2018   TRIG 154 (H) 12/06/2018   HDL 44 12/06/2018   CHOLHDL 3.3 12/06/2018   VLDL 31 12/06/2018   LDLCALC 72 12/06/2018   No results found for: TSH  Therapeutic Level Labs: No results found for: LITHIUM No results found for: VALPROATE No results found for: CBMZ   Screenings: AIMS    Flowsheet Row Admission (Discharged) [facility]  AIMS Total Score 0   AUDIT    Flowsheet Row Admission (Discharged) from [facility]  Alcohol Use Disorder Identification Test Final Score (AUDIT) 0   Flowsheet Row Admission (Discharged) from [facility]  C-SSRS RISK CATEGORY Low Risk    Collaboration of Care: Collaboration of Care: Medication Management AEB medication prescription   Patient/Guardian was advised Release of Information must be obtained prior to any record release in  order to collaborate their care with an outside provider. Patient/Guardian was advised if they have not already done so to contact the registration department to sign all necessary forms in order for us  to release information regarding their care.   Consent: Patient/Guardian gives verbal consent for treatment and assignment of benefits for services provided during this visit. Patient/Guardian expressed understanding and agreed to proceed.    Arvella CHRISTELLA Finder, MD 11/28/2024, 8:43 AM   Virtual Visit via Video Note  I connected with Ronal Ohs on 11/28/24 at  8:30 AM EST by a video enabled telemedicine application and verified that I am speaking with the correct person using two identifiers.  Location: Patient: [place] the park Provider: Home Office   I discussed the limitations of evaluation and management by telemedicine and the availability of in person appointments. The patient expressed understanding and agreed to proceed.   I discussed the assessment and treatment plan with the patient. The patient was provided an opportunity to ask questions and all were answered. The patient agreed with the plan and demonstrated an understanding of the instructions.   The patient was advised to call back or seek an in-person evaluation if the symptoms worsen or if the condition fails to improve as anticipated.  I provided 5 minutes of non-face-to-face time during this encounter.   Arvella CHRISTELLA Finder, MD

## 2024-11-28 ENCOUNTER — Encounter (HOSPITAL_COMMUNITY): Payer: Self-pay | Admitting: Psychiatry

## 2024-11-28 ENCOUNTER — Telehealth (HOSPITAL_COMMUNITY): Admitting: Psychiatry

## 2024-11-28 DIAGNOSIS — G47419 Narcolepsy without cataplexy: Secondary | ICD-10-CM

## 2024-11-28 DIAGNOSIS — F3341 Major depressive disorder, recurrent, in partial remission: Secondary | ICD-10-CM | POA: Diagnosis not present

## 2024-11-28 MED ORDER — VORTIOXETINE HBR 10 MG PO TABS: 15.0000 mg | ORAL_TABLET | Freq: Every day | ORAL | 0 refills | Status: AC

## 2024-11-28 MED ORDER — AMPHETAMINE-DEXTROAMPHETAMINE 10 MG PO TABS: 10.0000 mg | ORAL_TABLET | Freq: Two times a day (BID) | ORAL | 0 refills | Status: AC

## 2024-11-28 MED ORDER — PROPRANOLOL HCL 10 MG PO TABS: 10.0000 mg | ORAL_TABLET | Freq: Two times a day (BID) | ORAL | 1 refills | Status: AC | PRN

## 2024-12-10 ENCOUNTER — Emergency Department (HOSPITAL_BASED_OUTPATIENT_CLINIC_OR_DEPARTMENT_OTHER)
Admission: EM | Admit: 2024-12-10 | Discharge: 2024-12-10 | Disposition: A | Discharge status: Home / Self Care | Payer: Worker's Compensation | Source: Ambulatory Visit | Attending: Emergency Medicine | Admitting: Emergency Medicine

## 2024-12-10 ENCOUNTER — Encounter (HOSPITAL_BASED_OUTPATIENT_CLINIC_OR_DEPARTMENT_OTHER): Payer: Self-pay | Admitting: Emergency Medicine

## 2024-12-10 ENCOUNTER — Other Ambulatory Visit: Payer: Self-pay

## 2024-12-10 DIAGNOSIS — S61211A Laceration without foreign body of left index finger without damage to nail, initial encounter: Secondary | ICD-10-CM | POA: Insufficient documentation

## 2024-12-10 DIAGNOSIS — W268XXA Contact with other sharp object(s), not elsewhere classified, initial encounter: Secondary | ICD-10-CM | POA: Insufficient documentation

## 2024-12-10 MED ORDER — LIDOCAINE-EPINEPHRINE (PF) 2 %-1:200000 IJ SOLN
10.0000 mL | Freq: Once | INTRAMUSCULAR | Status: DC
  Filled 2024-12-10: qty 20

## 2024-12-10 NOTE — ED Triage Notes (Addendum)
 Lac to left pointer finger was scraping car with ice scraper and blade slipped Wrapped at [employer], sent for complex suturing? Happened around 11:45AM Tetanus UTD

## 2024-12-10 NOTE — Discharge Instructions (Addendum)
 You received sutures which are not dissolvable. Please return in 7-10 days for suture removal. I would place a bandage on the affected area and change it at least once daily, or more frequently if it becomes soiled. When changing bandage, clean the wound thoroughly with soap and water, and place a thin layer of Polysporin or Neosporin directly over top of the wound before replacing bandage. Please monitor for any signs of developing infection including worsening redness, pain, pus draining from the affected site.  If you are concerned about any developing infection I recommend that you return for further evaluation for management.

## 2024-12-10 NOTE — ED Provider Notes (Signed)
 " [facility] EMERGENCY DEPARTMENT AT [facility] Provider Note   CSN: [id number] Arrival date & time: 12/10/24  1307     Patient presents with: Laceration   Tara Vega is a 44 y.o. female with overall noncontributory past medical history who is up-to-date on tetanus who presents concern for left pointer finger injury/laceration while scraping car with a scraper.  Sent from occupational health due to complex wound requiring repair.  She reports some slightly decreased sensation at the area after having it wrapped.  She reports normal motion of the finger.  No other injuries at this time.    Laceration      Prior to Admission medications  Medication Sig Start Date End Date Taking? Authorizing Provider  amLODipine (NORVASC) 5 MG tablet Take 5 mg by mouth daily. 08/16/22   [provider]  amphetamine -dextroamphetamine  (ADDERALL) 10 MG tablet Take 1 tablet (10 mg total) by mouth 2 (two) times daily with a meal. 12/10/24 12/10/25  Carvin Arvella HERO, MD  amphetamine -dextroamphetamine  (ADDERALL) 10 MG tablet Take 1 tablet (10 mg total) by mouth 2 (two) times daily with a meal. 02/06/25 02/06/26  Carvin Arvella HERO, MD  amphetamine -dextroamphetamine  (ADDERALL) 10 MG tablet Take 1 tablet (10 mg total) by mouth 2 (two) times daily. 01/08/25 01/08/26  Carvin Arvella HERO, MD  metFORMIN (GLUCOPHAGE-XR) 500 MG 24 hr tablet Take 500 mg by mouth daily with supper. 08/29/24   [provider]  omeprazole (PRILOSEC) 40 MG capsule Take 40 mg by mouth daily. 08/16/22   [provider]  propranolol  (INDERAL ) 10 MG tablet Take 1 tablet (10 mg total) by mouth 2 (two) times daily as needed. 11/28/24   Carvin Arvella HERO, MD  rizatriptan (MAXALT) 5 MG tablet Take 5 mg by mouth as needed for migraine. 08/16/22   [provider]  vortioxetine  HBr (TRINTELLIX ) 10 MG TABS tablet Take 1.5 tablets (15 mg total) by mouth daily. 11/28/24   Carvin Arvella HERO, MD    Allergies: Aspirin    Review of  Systems  All other systems reviewed and are negative.   Updated Vital Signs BP (!) 136/92 (BP Location: Right Arm)   Pulse 72   Temp 98.3 F (36.8 C) (Oral)   Resp 18   SpO2 98%   Physical Exam Vitals and nursing note reviewed.  Constitutional:      General: She is not in acute distress.    Appearance: Normal appearance.  HENT:     Head: Normocephalic and atraumatic.  Eyes:     General:        Right eye: No discharge.        Left eye: No discharge.  Cardiovascular:     Rate and Rhythm: Normal rate and regular rhythm.  Pulmonary:     Effort: Pulmonary effort is normal. No respiratory distress.  Musculoskeletal:        General: No deformity.  Skin:    General: Skin is warm and dry.     Comments: L-shaped laceration on proximal phalanx of left index finger, no evidence of tendon injury, foreign body  Neurological:     Mental Status: She is alert and oriented to person, place, and time.  Psychiatric:        Mood and Affect: Mood normal.        Behavior: Behavior normal.     (all labs ordered are listed, but only abnormal results are displayed) Labs Reviewed - No data to display  EKG: None  Radiology: No  results found.   .Laceration Repair  Date/Time: 12/10/2024 4:47 PM  Performed by: Rosan Sherlean DEL, PA-C Authorized by: Rosan Sherlean DEL, PA-C   Consent:    Consent obtained:  Verbal   Consent given by:  Patient   Risks, benefits, and alternatives were discussed: yes     Risks discussed:  Infection, pain, poor cosmetic result, tendon damage, vascular damage, poor wound healing, need for additional repair and nerve damage   Alternatives discussed:  No treatment Universal protocol:    Procedure explained and questions answered to patient or proxy's satisfaction: yes     Patient identity confirmed:  Verbally with patient Anesthesia:    Anesthesia method:  Local infiltration   Local anesthetic:  Lidocaine  2% WITH epi Laceration details:    Location:   Finger   Finger location:  L index finger   Length (cm):  5   Depth (mm):  4 Treatment:    Area cleansed with:  Shur-Clens   Amount of cleaning:  Standard Skin repair:    Repair method:  Sutures   Suture size:  4-0   Suture material:  Prolene   Suture technique:  Simple interrupted   Number of sutures:  9 Approximation:    Approximation:  Close Repair type:    Repair type:  Simple Post-procedure details:    Dressing:  Non-adherent dressing   Procedure completion:  Tolerated    Medications Ordered in the ED  lidocaine -EPINEPHrine  (XYLOCAINE  W/EPI) 2 %-1:200000 (PF) injection 10 mL (has no administration in time range)                                    Medical Decision Making Risk Prescription drug management.   This is an overall well-appearing 44 yo female who presents with a laceration of the left index figner.  The wound was explored through full range of motion, there is no evidence of foreign body, fascia rupture, damage to the deep vessels, capillary refill is intact distal to the site of the laceration.  Patient is up-to-date on their tetanus.  They are neurovascularly intact throughout on my exam, intact strength of the affected extremity.   Wound was extensively cleaned, and repaired as described above.  Patient tolerated the procedure without difficulty.  Nonadherent dressing was placed.  Patient instructed to return in 7-10 days for suture removal, and monitor for signs of infection including worsening pain, redness, pus draining from the affected site.  Instructed to keep the wound clean, bandage, and change the bandage at least once daily.  Patient understands and agrees to the plan, and is discharged in stable condition at this time.   Final diagnoses:  Laceration of left index finger without foreign body without damage to nail, initial encounter    ED Discharge Orders     None          Rosan Sherlean DEL DEVONNA 12/10/24 1647    Tegeler,  Lonni PARAS, MD 12/10/24 1717  "

## 2024-12-13 ENCOUNTER — Ambulatory Visit: Admitting: *Deleted

## 2024-12-13 ENCOUNTER — Encounter: Payer: Self-pay | Admitting: Internal Medicine

## 2024-12-13 VITALS — Ht 61.0 in | Wt 226.0 lb

## 2024-12-13 DIAGNOSIS — Z1211 Encounter for screening for malignant neoplasm of colon: Secondary | ICD-10-CM

## 2024-12-13 MED ORDER — NA SULFATE-K SULFATE-MG SULF 17.5-3.13-1.6 GM/177ML PO SOLN: 1.0000 | Freq: Once | ORAL | 0 refills | Status: AC

## 2024-12-13 NOTE — Progress Notes (Addendum)
 Pt's name and DOB verified at the beginning of the pre-visit with 2 identifiers   Pt denies any difficulty with ambulating,sitting, laying down or rolling side to side  Pt has no issues moving head neck or swallowing  No egg or soy allergy known to patient   No issues known to pt with past sedation  No FH of Malignant Hyperthermia  Pt is not on home 02   Pt is not on blood thinners   Pt denies issues with constipation   Pt has frequent issues with constipation RN instructed pt to use Miralax per bottles instructions a week before prep days. Pt states they will  Pt is not on dialysis  Pt denise any abnormal heart rhythms   Pt denies any upcoming cardiac testing  Patient's chart reviewed by Norleen Schillings CNRA prior to pre-visit and patient appropriate for the [facility].  Pre-visit completed and red dot placed by patient's name on their procedure day (on provider's schedule).     Visit by video   Pt states weight is 226 lb   Pt given  both [facility] main # and MD on call # prior to instructions.  Informed pt to come in at the time discussed and is shown on PV instructions.  Pt instructed to use [employer] or GoodRx for a price reduction on prep  Instructed pt where to find PV instructions in My Chart. . Instructed pt on all aspects of written instructions including med holds clothing to wear and foods to eat and not eat as well as after procedure legal restrictions and to call MD on call if needed.. Pt states understanding. Instructed pt to review instructions again prior to procedure and call main # given if has any questions or any issues. Pt states they will.

## 2024-12-27 ENCOUNTER — Encounter: Admitting: Internal Medicine

## 2025-02-25 ENCOUNTER — Telehealth (HOSPITAL_COMMUNITY): Admitting: Psychiatry
